# Patient Record
Sex: Male | Born: 1935 | Race: White | Hispanic: No | Smoking: Former smoker
Health system: Southern US, Community
[De-identification: ages and names within clinical notes are randomized; demographics above are authoritative.]

## PROBLEM LIST (undated history)

## (undated) DIAGNOSIS — J8410 Pulmonary fibrosis, unspecified: Secondary | ICD-10-CM

## (undated) DIAGNOSIS — M1990 Unspecified osteoarthritis, unspecified site: Secondary | ICD-10-CM

## (undated) DIAGNOSIS — I10 Essential (primary) hypertension: Secondary | ICD-10-CM

## (undated) HISTORY — PX: TESTICLE SURGERY: SHX794

---

## 2007-04-03 ENCOUNTER — Encounter: Payer: Self-pay | Admitting: Internal Medicine

## 2007-04-03 ENCOUNTER — Ambulatory Visit: Payer: Self-pay | Admitting: Internal Medicine

## 2007-04-03 ENCOUNTER — Ambulatory Visit (HOSPITAL_COMMUNITY): Admission: RE | Admit: 2007-04-03 | Discharge: 2007-04-03 | Payer: Self-pay | Admitting: Internal Medicine

## 2009-09-29 ENCOUNTER — Inpatient Hospital Stay (HOSPITAL_COMMUNITY): Admission: AD | Admit: 2009-09-29 | Discharge: 2009-10-03 | Payer: Self-pay | Admitting: Internal Medicine

## 2009-09-30 ENCOUNTER — Encounter: Payer: Self-pay | Admitting: Internal Medicine

## 2009-10-14 DIAGNOSIS — D696 Thrombocytopenia, unspecified: Secondary | ICD-10-CM

## 2009-10-14 DIAGNOSIS — J8410 Pulmonary fibrosis, unspecified: Secondary | ICD-10-CM

## 2009-10-14 DIAGNOSIS — I10 Essential (primary) hypertension: Secondary | ICD-10-CM | POA: Insufficient documentation

## 2009-10-14 DIAGNOSIS — E876 Hypokalemia: Secondary | ICD-10-CM | POA: Insufficient documentation

## 2009-10-14 DIAGNOSIS — M069 Rheumatoid arthritis, unspecified: Secondary | ICD-10-CM | POA: Insufficient documentation

## 2009-10-15 ENCOUNTER — Ambulatory Visit: Payer: Self-pay | Admitting: Internal Medicine

## 2009-11-27 ENCOUNTER — Ambulatory Visit: Payer: Self-pay | Admitting: Internal Medicine

## 2010-05-26 NOTE — Assessment & Plan Note (Signed)
Summary: Pulmonary/ new pt eval for RA/ ILD   Visit Type:  Initial Consult Copy to:  Dr. Carylon Perches Primary Provider/Referring Provider:  Dr. Carylon Perches  CC:  Pulmonary Fibrosis.  History of Present Illness: 77 yowm quit smoking in 1965 with no resp issues but slowed some by RA which has been more active x 7-8 years requiring mtx and remicade.  October 15, 2009 1st pulmonary eval for abn ct with recent tick bites with fever up to 103 gait disturbance  and fatigue and loss appetite with 10 lb and better on day of ov.  No limiting doe but not very active. no cough.  Pt denies any significant sore throat, dysphagia, itching, sneezing,  nasal congestion or excess secretions,  fever, chills, sweats, unintended wt loss, pleuritic or exertional cp, hempoptysis, change in activity tolerance  orthopnea pnd or leg swelling Pt also denies any obvious fluctuation in symptoms with weather or environmental change or other alleviating or aggravating factors.       Current Medications (verified): 1)  Doxycycline Hyclate 100 Mg Caps (Doxycycline Hyclate) .... Take 1 Tablet By Mouth Two Times A Day 2)  Tenormin 50 Mg Tabs (Atenolol) .... Take 1 Tablet By Mouth Two Times A Day 3)  Hydrochlorothiazide 25 Mg Tabs (Hydrochlorothiazide) .... Take 1 Tablet By Mouth Once A Day 4)  Folic Acid 1 Mg Tabs (Folic Acid) .... Take 1 Tablet By Mouth Once A Day 5)  Aspirin 81 Mg Tbec (Aspirin) .... Take 1 Tablet By Mouth Once A Day 6)  Alprazolam 1 Mg Tabs (Alprazolam) .... 1/2 At Bedtime As Needed 7)  Hydroxyzine Hcl 25 Mg Tabs (Hydroxyzine Hcl) .Marland Kitchen.. 1 Two Times A Day As Needed 8)  Benadryl 25 Mg Tabs (Diphenhydramine Hcl) .... As Directed 3 Days Prior To Taking Rheumacaid 9)  Calcium-Vitamin D 600-125 Mg-Unit Tabs (Calcium-Vitamin D) .Marland Kitchen.. 1 Two Times A Day 10)  Vitamin B-12 1000 Mcg Tabs (Cyanocobalamin) .Marland Kitchen.. 1 Once Daily 11)  Alendronate Sodium 70 Mg Tabs (Alendronate Sodium) .... Once Per Wk 12)  Vitamin D3 1000 Unit Tabs  (Cholecalciferol) .Marland Kitchen.. 1 Once Daily 13)  Methotrexate 2.5 Mg Tabs (Methotrexate Sodium) .... 8 Tablets Every Friday  Allergies (verified): No Known Drug Allergies  Past History:  Past Medical History: THROMBOCYTOPENIA (ICD-287.5) HYPERTENSION (ICD-401.9) HYPOKALEMIA (ICD-276.8) PULMONARY FIBROSIS (ICD-515)    - CT chest 09/30/2009    - PFT's ordered October 16, 2009  ARTHRITIS, RHEUMATOID (ICD-714.0)..............................Marland KitchenDr Wilford Grist       - Stopped all rx on September 29, 2009 due to fuo    Past Surgical History: Hernia repair 1965  Social History: Former smoker.  Quit in 1965.  Smoked for approx 10 yrs- ? how many cigs per day No ETOH Married  Review of Systems  The patient denies anorexia, fever, weight loss, weight gain, vision loss, decreased hearing, hoarseness, chest pain, syncope, dyspnea on exertion, peripheral edema, prolonged cough, headaches, hemoptysis, abdominal pain, melena, hematochezia, severe indigestion/heartburn, hematuria, incontinence, genital sores, muscle weakness, suspicious skin lesions, transient blindness, difficulty walking, depression, unusual weight change, abnormal bleeding, enlarged lymph nodes, angioedema, breast masses, and testicular masses.    Vital Signs:  Patient profile:   77 year old male Height:      69 inches Weight:      169 pounds BMI:     25.05 O2 Sat:      96 % on Room air Temp:     96.9 degrees F oral Pulse rate:   56 / minute BP  sitting:   112 / 70  (left arm)  Vitals Entered By: Vernie Murders (October 15, 2009 1:41 PM)  O2 Flow:  Room air   CXR  Procedure date:  09/30/2009  Findings:      PF with non-specific adenopathy and no GG changes  Impression & Recommendations:  Problem # 1:  PULMONARY FIBROSIS (ICD-515) DDx for pulmonary fibrosis with honeycombing includes idiopathic pulmonary fibrosis, pulmonary fibrosis associated with rheumatologic disease, adverse effect from  drugs such as chemotherapy or amiodarone  exposure (or MTX), nonspecific interstitial pneumonia which is typically steroid responsive, and chronic hypersensitivity pneumonitis.   Based on absence of clubbing and presentation with RA co's >>> resp symptoms in all likelihood this is the type of PF assoc with CV dz which has a relatively good prognosis.  Hard to exclude mtx lung dz but doubt clinically and ok to rechallenge and see how he does clinically but ideally need to get baseline pft's first.  Medications Added to Medication List This Visit: 1)  Alprazolam 1 Mg Tabs (Alprazolam) .... 1/2 at bedtime as needed 2)  Hydroxyzine Hcl 25 Mg Tabs (Hydroxyzine hcl) .Marland Kitchen.. 1 two times a day as needed 3)  Benadryl 25 Mg Tabs (Diphenhydramine hcl) .... As directed 3 days prior to taking rheumacaid 4)  Calcium-vitamin D 600-125 Mg-unit Tabs (Calcium-vitamin d) .Marland Kitchen.. 1 two times a day 5)  Vitamin B-12 1000 Mcg Tabs (Cyanocobalamin) .Marland Kitchen.. 1 once daily 6)  Alendronate Sodium 70 Mg Tabs (Alendronate sodium) .... Once per wk 7)  Vitamin D3 1000 Unit Tabs (Cholecalciferol) .Marland Kitchen.. 1 once daily 8)  Methotrexate 2.5 Mg Tabs (Methotrexate sodium) .... 8 tablets every friday  Other Orders: New Patient Level V (16109)  Patient Instructions: 1)  Stay as active as you can and let us know if breathing is what limits your activity 2)  Please schedule a follow-up appointment in 6 weeks, sooner if needed for PFT's and CXR  3)  Ok with me for use Methotrexate for your arthritis unless you find your breathing is getting while your arthritis is not  4)  Copy sent to: 5)  Dr Ouida Sills and Kerri Perches

## 2010-05-26 NOTE — Miscellaneous (Signed)
Summary: Orders Update pft charges  Clinical Lists Changes  Orders: Added new Service order of Lung Volumes (94240) - Signed Added new Service order of Carbon Monoxide diffusing w/capacity (94720) - Signed Added new Service order of Spirometry (Pre & Post) (94060) - Signed 

## 2010-05-26 NOTE — Assessment & Plan Note (Signed)
Summary: Pulmonary/ final summary f/u ov with pft's wnl    Visit Type:  Follow-up Copy to:  Dr. Carylon Perches Primary Provider/Referring Provider:  Dr. Carylon Perches  CC:  Patient here for follow-up today and had PFT's. No complaints..  History of Present Illness: 39 yowm quit smoking in 1965 with no resp issues but slowed some by RA which has been more active x 7-8 years requiring mtx and remicade.  October 15, 2009 1st pulmonary eval for abn ct with recent tick bites with fever up to 103 gait disturbance  and fatigue and loss appetite with 10 lb and better on day of ov.  No limiting doe but not very active. no cough.  restarted on mtx     November 27, 2009 Patient here for follow-up today and had PFT's. No complaints,  ra is better but not back to baseline. no cough or sob.  Pt denies any significant sore throat, dysphagia, itching, sneezing,  nasal congestion or excess secretions,  fever, chills, sweats, unintended wt loss, pleuritic or exertional cp, hempoptysis, change in activity tolerance  orthopnea pnd or leg swelling. Pt also denies any obvious fluctuation in symptoms with weather or environmental change or other alleviating or aggravating factors.       Current Medications (verified): 1)  Doxycycline Hyclate 100 Mg Caps (Doxycycline Hyclate) .... Take 1 Tablet By Mouth Two Times A Day 2)  Tenormin 50 Mg Tabs (Atenolol) .... Take 1 Tablet By Mouth Two Times A Day 3)  Hydrochlorothiazide 25 Mg Tabs (Hydrochlorothiazide) .... Take 1 Tablet By Mouth Once A Day 4)  Folic Acid 1 Mg Tabs (Folic Acid) .... Take 1 Tablet By Mouth Once A Day 5)  Aspirin 81 Mg Tbec (Aspirin) .... Take 1 Tablet By Mouth Once A Day 6)  Alprazolam 1 Mg Tabs (Alprazolam) .... 1/2 At Bedtime As Needed 7)  Hydroxyzine Hcl 25 Mg Tabs (Hydroxyzine Hcl) .Marland Kitchen.. 1 Two Times A Day As Needed 8)  Benadryl 25 Mg Tabs (Diphenhydramine Hcl) .... As Directed 3 Days Prior To Taking Rheumacaid 9)  Calcium-Vitamin D 600-125 Mg-Unit Tabs  (Calcium-Vitamin D) .Marland Kitchen.. 1 Two Times A Day 10)  Vitamin B-12 1000 Mcg Tabs (Cyanocobalamin) .Marland Kitchen.. 1 Once Daily 11)  Alendronate Sodium 70 Mg Tabs (Alendronate Sodium) .... Once Per Wk 12)  Vitamin D3 1000 Unit Tabs (Cholecalciferol) .Marland Kitchen.. 1 Once Daily 13)  Methotrexate 2.5 Mg Tabs (Methotrexate Sodium) .... 8 Tablets Every Friday  Allergies (verified): No Known Drug Allergies  Past History:  Past Medical History: THROMBOCYTOPENIA (ICD-287.5) HYPERTENSION (ICD-401.9) HYPOKALEMIA (ICD-276.8) PULMONARY FIBROSIS (ICD-515)    - CT chest 09/30/2009    - PFT's November 27, 2009 FEV1 2.71 (98%) ratio 67%  DLC0 77% . corrects to 97% ARTHRITIS, RHEUMATOID (ICD-714.0)..............................Marland KitchenDr Wilford Grist       - Stopped all rx on September 29, 2009 due to fuo    Vital Signs:  Patient profile:   77 year old male Height:      70 inches (177.80 cm) Weight:      164 pounds (74.55 kg) BMI:     23.62 O2 Sat:      96 % on Room air Temp:     97.4 degrees F (36.33 degrees C) oral Pulse rate:   83 / minute BP sitting:   124 / 76  (right arm) Cuff size:   regular  Vitals Entered By: Michel Bickers CMA (November 27, 2009 11:26 AM)  O2 Sat at Rest %:  96 O2  Flow:  Room air CC: Patient here for follow-up today and had PFT's. No complaints. Comments Medications reviewed with the patient. Daytime phone verified. Michel Bickers CMA  November 27, 2009 11:28 AM   Physical Exam  Additional Exam:  somewhat cantankerous amb wm nad HEENT mild turbinate edema.  Oropharynx no thrush or excess pnd or cobblestoning.  No JVD or cervical adenopathy. Mild accessory muscle hypertrophy. Trachea midline, nl thryroid. Chest was hyperinflated by percussion with diminished breath sounds and moderate increased exp time without wheeze. Hoover sign positive at mid inspiration. Regular rate and rhythm without murmur gallop or rub or increase P2 or edema.  Abd: no hsm, nl excursion. Ext warm without cyanosis or clubbing.  obvious ra  changes both hands including MCPs   CXR  Procedure date:  11/27/2009  Findings:      mild/ moderate fibrotic pattern with no change from prev studies  Impression & Recommendations:  Problem # 1:  PULMONARY FIBROSIS (ICD-515)  DDx for pulmonary fibrosis with honeycombing includes idiopathic pulmonary fibrosis, pulmonary fibrosis associated with rheumatologic disease, adverse effect from  drugs such as chemotherapy or amiodarone exposure (or MTX), nonspecific interstitial pneumonia which is typically steroid responsive, and chronic hypersensitivity pneumonitis.   Based on absence of clubbing and presentation with RA co's >>> resp symptoms in all likelihood this is the type of PF assoc with CV dz which has a relatively good prognosis.  Hard to exclude mtx lung dz but doubt clinically and ok p  rechallenged  - can follow with serial cxr and pft's yearly at this point unless new pulmonary symptoms develop based on a very good baseline functional level established today despite smoking hx  Other Orders: T-2 View CXR (71020TC) Est. Patient Level IV (16109)  Patient Instructions: 1)  Follow up here if breathing worsens or if your doctors want [place] to track your lung function on methotrexate with annual cxr and lung functions   Appended Document: Pulmonary/ final summary f/u ov with pft's wnl  copy also to Dr Wilford Grist

## 2010-07-13 LAB — URINALYSIS, ROUTINE W REFLEX MICROSCOPIC
Bilirubin Urine: NEGATIVE
Leukocytes, UA: NEGATIVE
Nitrite: NEGATIVE
Urobilinogen, UA: 0.2 mg/dL (ref 0.0–1.0)
pH: 6.5 (ref 5.0–8.0)

## 2010-07-13 LAB — DIFFERENTIAL
Basophils Absolute: 0 10*3/uL (ref 0.0–0.1)
Basophils Absolute: 0 10*3/uL (ref 0.0–0.1)
Basophils Absolute: 0.1 10*3/uL (ref 0.0–0.1)
Basophils Relative: 1 % (ref 0–1)
Eosinophils Absolute: 0 10*3/uL (ref 0.0–0.7)
Eosinophils Absolute: 0 10*3/uL (ref 0.0–0.7)
Lymphs Abs: 0.2 10*3/uL — ABNORMAL LOW (ref 0.7–4.0)
Lymphs Abs: 1 10*3/uL (ref 0.7–4.0)
Monocytes Absolute: 0.4 10*3/uL (ref 0.1–1.0)
Monocytes Absolute: 0.5 10*3/uL (ref 0.1–1.0)
Monocytes Absolute: 1.6 10*3/uL — ABNORMAL HIGH (ref 0.1–1.0)
Monocytes Relative: 25 % — ABNORMAL HIGH (ref 3–12)
Monocytes Relative: 9 % (ref 3–12)
Neutro Abs: 3.6 10*3/uL (ref 1.7–7.7)
Neutro Abs: 5.1 10*3/uL (ref 1.7–7.7)
Neutro Abs: 6.4 10*3/uL (ref 1.7–7.7)
Neutrophils Relative %: 57 % (ref 43–77)
Neutrophils Relative %: 85 % — ABNORMAL HIGH (ref 43–77)
Neutrophils Relative %: 91 % — ABNORMAL HIGH (ref 43–77)

## 2010-07-13 LAB — BASIC METABOLIC PANEL
CO2: 25 mEq/L (ref 19–32)
Calcium: 7.5 mg/dL — ABNORMAL LOW (ref 8.4–10.5)
Calcium: 7.7 mg/dL — ABNORMAL LOW (ref 8.4–10.5)
Chloride: 104 mEq/L (ref 96–112)
Creatinine, Ser: 0.95 mg/dL (ref 0.4–1.5)
GFR calc Af Amer: >60 mL/min (ref >60)
GFR calc Af Amer: >60 mL/min (ref >60)
Glucose, Bld: 99 mg/dL (ref 70–99)
Potassium: 3.1 mEq/L — ABNORMAL LOW (ref 3.5–5.1)

## 2010-07-13 LAB — CULTURE, BLOOD (ROUTINE X 2)
Culture: NO GROWTH
Culture: NO GROWTH
Report Status: 6112011

## 2010-07-13 LAB — EPSTEIN-BARR VIRUS VCA ANTIBODY PANEL
EBV EA IgG: 2.08 {ISR} — ABNORMAL HIGH
EBV NA IgG: 4.72 {ISR} — ABNORMAL HIGH

## 2010-07-13 LAB — COMPREHENSIVE METABOLIC PANEL
Albumin: 3.7 g/dL (ref 3.5–5.2)
BUN: 18 mg/dL (ref 6–23)
Calcium: 8.7 mg/dL (ref 8.4–10.5)
Glucose, Bld: 129 mg/dL — ABNORMAL HIGH (ref 70–99)
Potassium: 3.7 mEq/L (ref 3.5–5.1)
Sodium: 127 mEq/L — ABNORMAL LOW (ref 135–145)
Total Protein: 8.4 g/dL — ABNORMAL HIGH (ref 6.0–8.3)

## 2010-07-13 LAB — ROCKY MTN SPOTTED FVR AB, IGM-BLOOD: RMSF IgM: 0.25 IV (ref 0.00–0.89)

## 2010-07-13 LAB — CBC
HCT: 40.7 % (ref 39.0–52.0)
Hemoglobin: 13.4 g/dL (ref 13.0–17.0)
MCHC: 34.3 g/dL (ref 30.0–36.0)
MCHC: 34.5 g/dL (ref 30.0–36.0)
MCV: 92.9 fL (ref 78.0–100.0)
MCV: 94.3 fL (ref 78.0–100.0)
MCV: 94.5 fL (ref 78.0–100.0)
Platelets: 130 10*3/uL — ABNORMAL LOW (ref 150–400)
Platelets: 163 10*3/uL (ref 150–400)
RBC: 3.95 MIL/uL — ABNORMAL LOW (ref 4.22–5.81)
RBC: 4.13 MIL/uL — ABNORMAL LOW (ref 4.22–5.81)

## 2010-07-13 LAB — ROCKY MTN SPOTTED FVR AB, IGG-BLOOD: RMSF IgG: 0.13 IV

## 2010-07-13 LAB — URINE CULTURE: Colony Count: NO GROWTH

## 2010-07-13 LAB — URINE MICROSCOPIC-ADD ON

## 2010-09-08 NOTE — Op Note (Signed)
NAME:  Patrick Armstrong, SCHAFFERT             ACCOUNT NO.:  [id number]   MEDICAL RECORD NO.:  [id number]          PATIENT TYPE:  AMB   LOCATION:  DAY                           FACILITY:  [facility]   PHYSICIAN:  R. Roetta Sessions, M.D. DATE OF BIRTH:  1932/03/30   DATE OF PROCEDURE:  04/03/2007  DATE OF DISCHARGE:                               OPERATIVE REPORT   ILEOCOLONOSCOPY WITH BIOPSY:   INDICATIONS FOR PROCEDURE:  A 77 year old gentleman sent over through  the courtesy of Dr. Ouida Sills for colorectal cancer screening via  colonoscopy.  He has never had his lower GI tract imaged.  He is devoid  of any lower GI tract symptoms.  There is no family history of  colorectal neoplasia.  Colonoscopy is now being done as a screening  maneuver.  This approach has been discussed with the patient at length.  Potential risks, benefits and alternatives have been reviewed, questions  answered, he is agreeable.  .  Please see the documentation in the  medical record.   PROCEDURE NOTE:  O2 saturation, blood pressure, pulse and respirations  were monitored throughout the entirety of the procedure.  Conscious  sedation:  Versed 3 mg IV, Demerol 75 mg IV in divided doses.  Instrument:  Pentax video chip system.  Digital rectal exam revealed no  abnormalities.  Endoscopic findings:  The prep was adequate.   Colon:  Colonic mucosa was surveyed from the rectosigmoid junction  through the left, transverse and right colon to area of the appendiceal  orifice, ileocecal valve and cecum.  These structures were well-seen and  photographed for the record.  The terminal ileum was intubated to 5 cm.  From this level the scope was slowly and cautiously withdrawn.  All the  mucosal surfaces were once again surveyed.  Quite a bit of time was  taken using tip deflection for fold flattening to see all the mucosal  surfaces.  The patient had pancolonic diverticula (all the way to the  cecum, left-sided greater than right-sided).  In  the mid descending  colon there were some somewhat-fatty appearing folds.  There was one  fold that was probably three times the width of the next largest fold,  had some superficial erosions.  There did not appear to be any other  abnormal mucosal process.  This appeared to be a lipomatous-type, benign-  appearing fold.  This area was biopsied to prove benignity.  The  remainder of the colonic mucosa appeared normal.  The scope was pulled  down into the rectum where a thorough examination of the rectal mucosa  including retroflexed view of the anal verge demonstrated no  abnormalities.  Cecal withdrawal time 10 minutes.  The patient tolerated  the procedure well, was reacted in endoscopy.   IMPRESSION:  1. Normal rectum.  2. Pancolonic diverticula (left-sided greater than right-sided).  3. Focally thickened folds, mid descending colon.  One sentinel fold      was particularly thick with some superficial erosions.  This      appeared to be a benign finding.  This fold was biopsied.   RECOMMENDATIONS:  1.  Diverticulosis literature provided to Mr. Morrie Sheldon.  2. Follow up on pathology.  3. Further recommendations to follow.      Jonathon Bellows, M.D.  Electronically Signed     RMR/MEDQ  D:  04/03/2007  T:  04/03/2007  Job:  [id number]   cc:   Kingsley Callander. Ouida Sills, MD  Fax: [phone]

## 2011-03-01 ENCOUNTER — Ambulatory Visit (HOSPITAL_COMMUNITY)
Admission: RE | Admit: 2011-03-01 | Discharge: 2011-03-01 | Disposition: A | Discharge status: Home / Self Care | Payer: Medicare Other | Source: Ambulatory Visit | Attending: Internal Medicine | Admitting: Internal Medicine

## 2011-03-01 ENCOUNTER — Other Ambulatory Visit (HOSPITAL_COMMUNITY): Payer: Self-pay | Admitting: Internal Medicine

## 2011-03-01 DIAGNOSIS — J8410 Pulmonary fibrosis, unspecified: Secondary | ICD-10-CM

## 2011-05-27 DIAGNOSIS — M069 Rheumatoid arthritis, unspecified: Secondary | ICD-10-CM | POA: Diagnosis not present

## 2011-05-28 DIAGNOSIS — M069 Rheumatoid arthritis, unspecified: Secondary | ICD-10-CM | POA: Diagnosis not present

## 2011-06-11 IMAGING — CT CT CHEST W/ CM
2 of 5 series · 15 of 36 positions shown, 18 images · IV contrast (Omnipaque 300)
Comparison: 09/29/2009 chest x-ray

CLINICAL DATA: Abnormal chest x-ray with interstitial opacities.
Patient with fever.

CT CHEST WITH CONTRAST
TECHNIQUE: Multidetector CT imaging of the chest was performed
following the standard protocol during bolus administration of
intravenous contrast.
Contrast: 80 ml intravenous Omnipaque-G66

[Series 2: chestroutine 5.0 b40f · axial · 0.69mm/px · z∈[-330,-35]mm · 12 of 65 slices shown, 15 images]
[im 3/65  mediastinal]
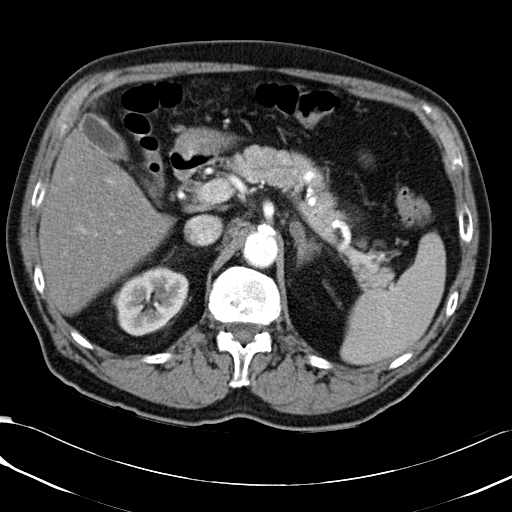
[im 3/65  lung]
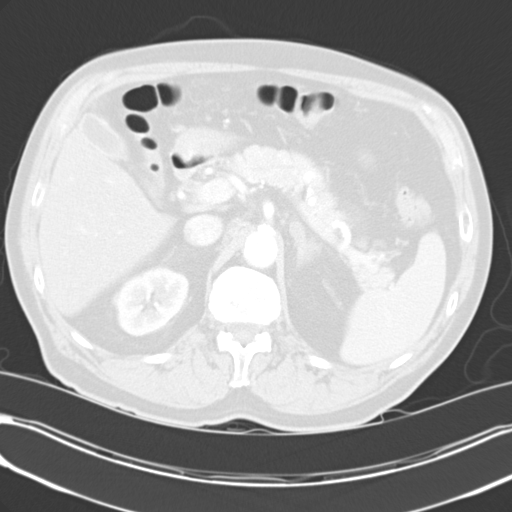
[im 9/65  lung]
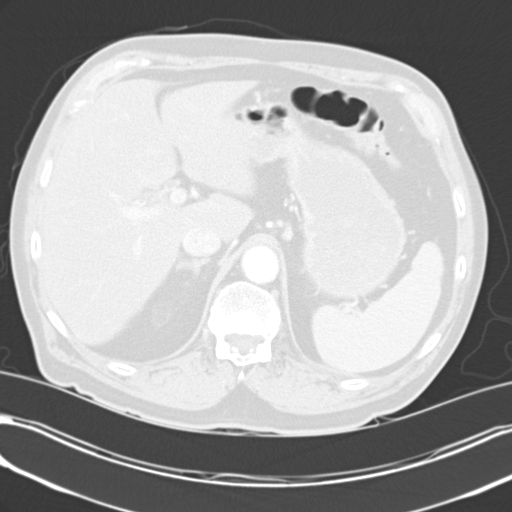
[im 14/65  lung]
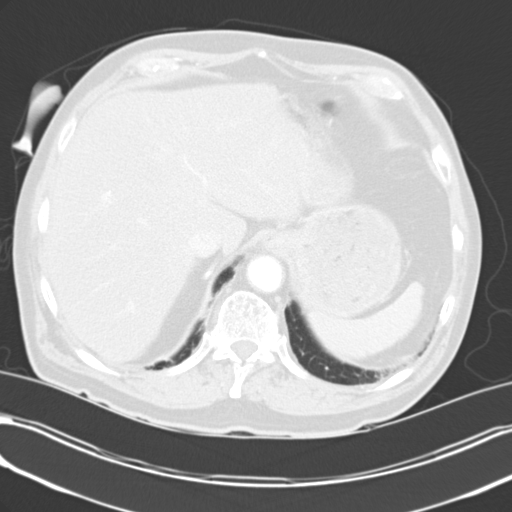
[im 20/65  lung]
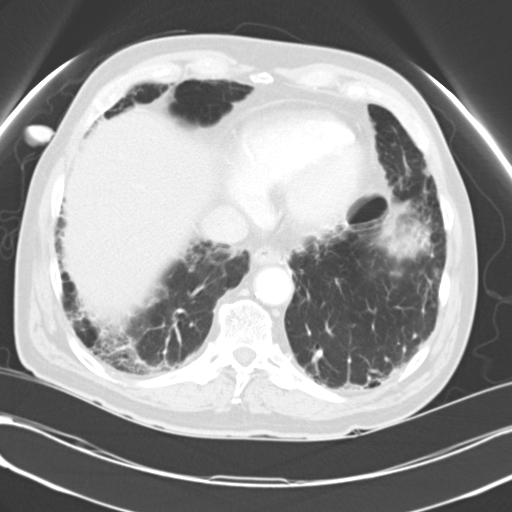
[im 26/65  mediastinal]
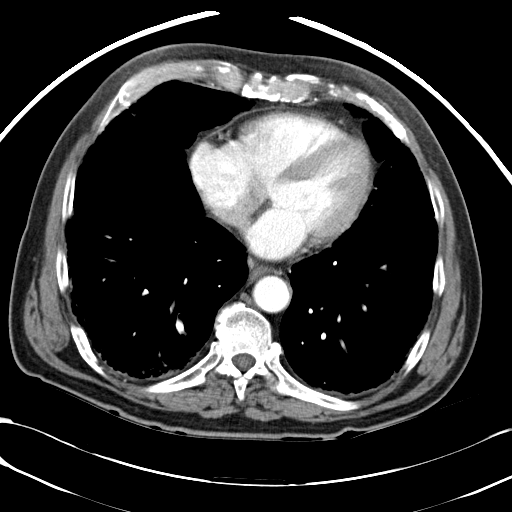
[im 26/65  lung]
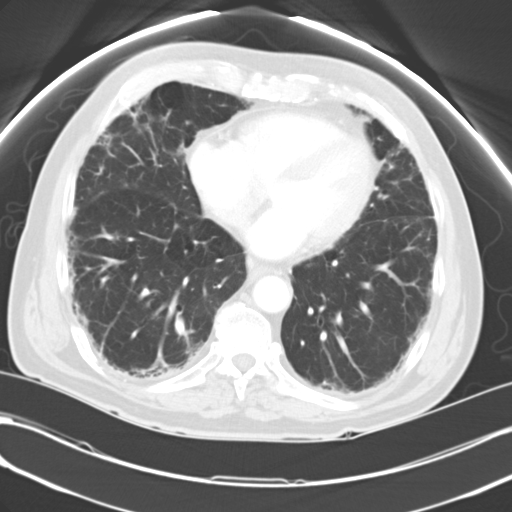
[im 31/65  lung]
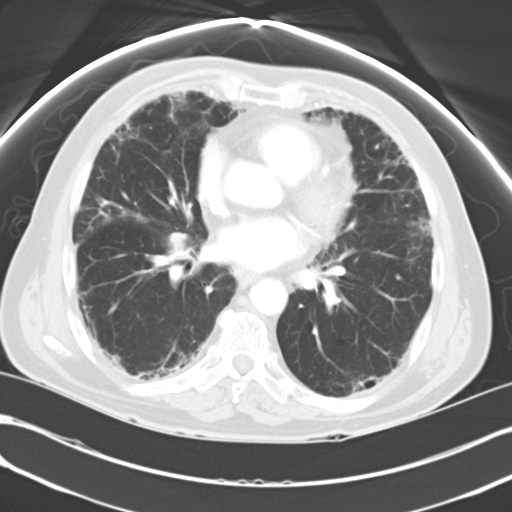
[im 34/65  lung]
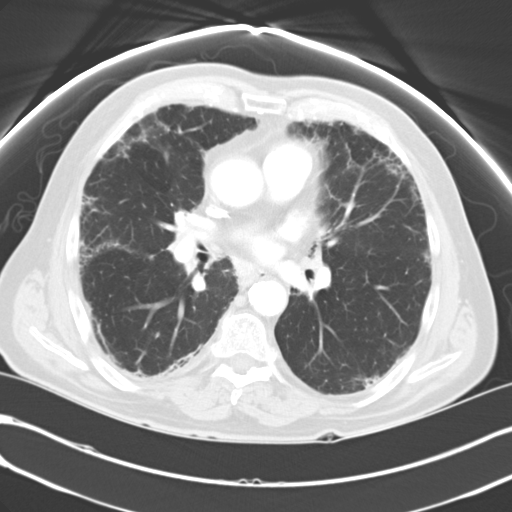
[im 39/65  lung]
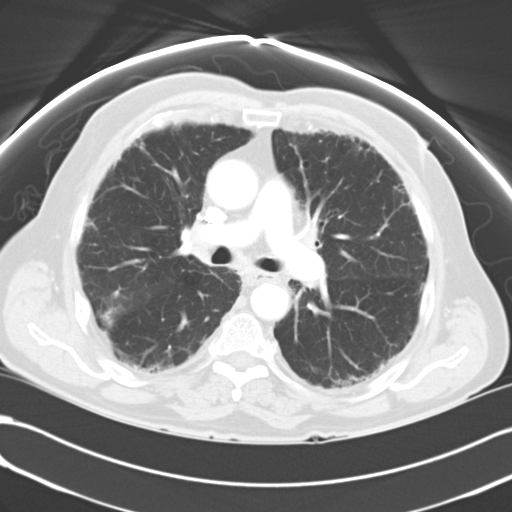
[im 45/65  mediastinal]
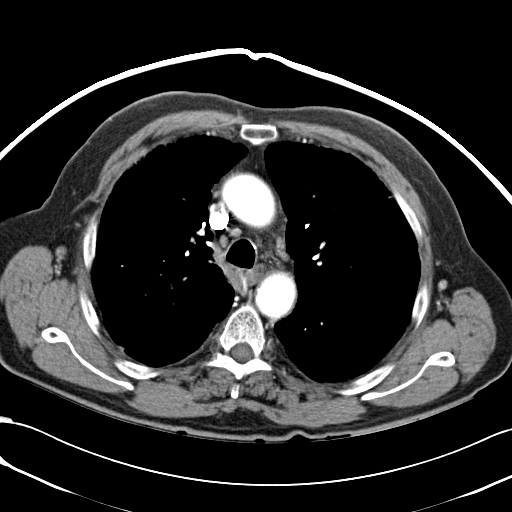
[im 45/65  lung]
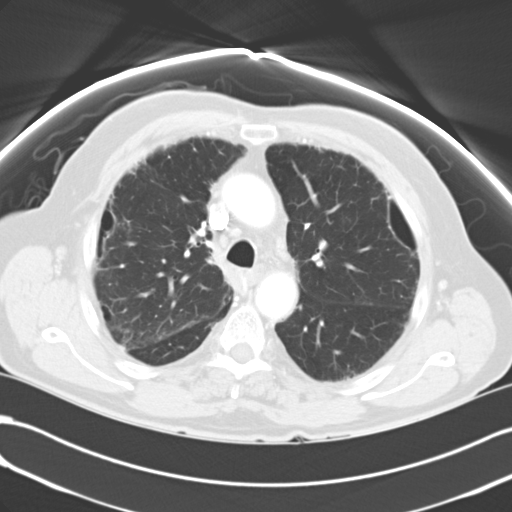
[im 51/65  lung]
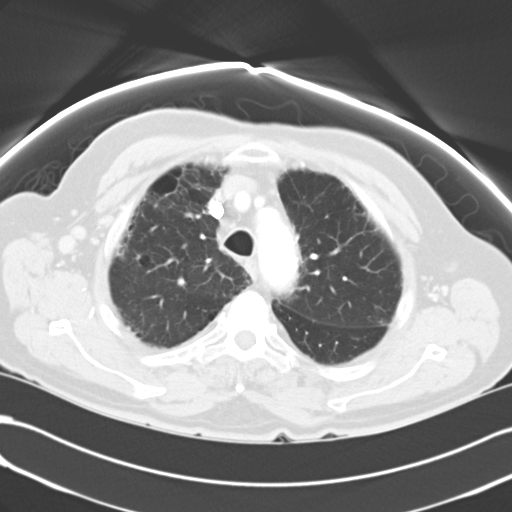
[im 56/65  lung]
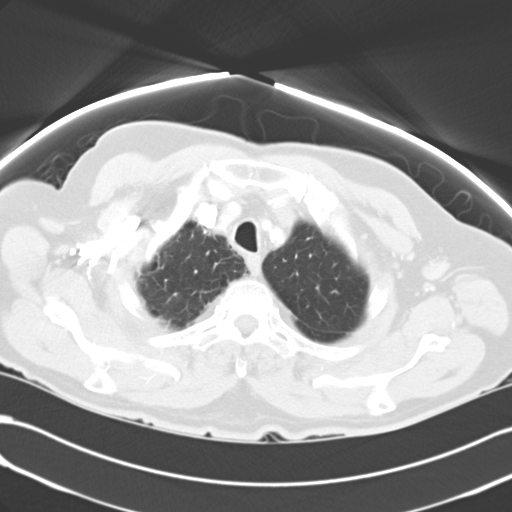
[im 62/65  lung]
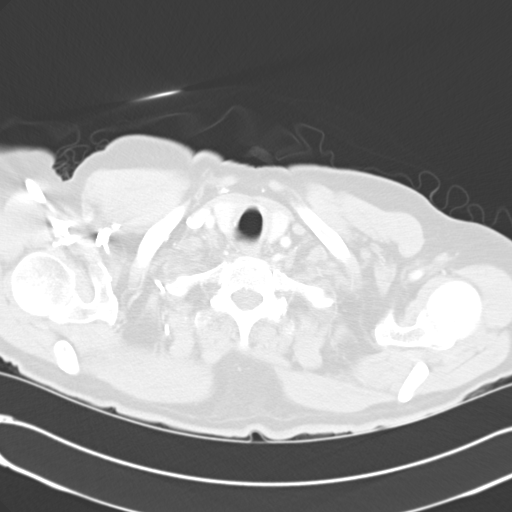

[Series 4: mpr coronal chest · coronal · 0.63mm/px · 3 of 83 slices shown]
[im 17/83  lung]
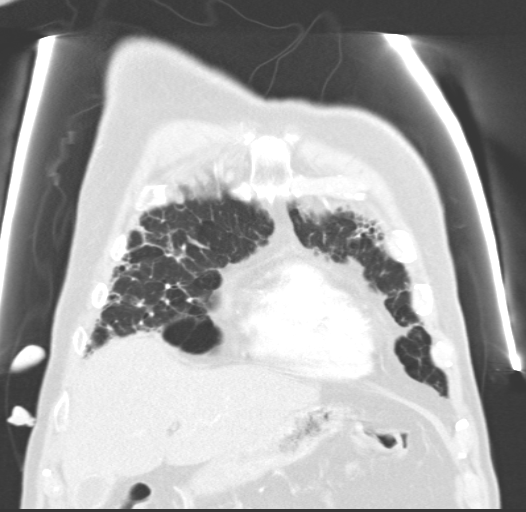
[im 33/83  lung]
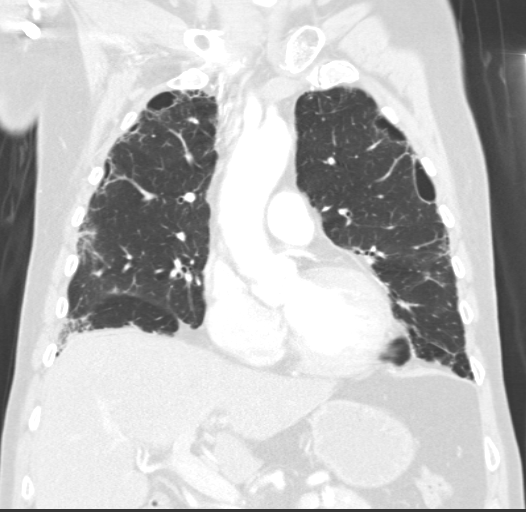
[im 50/83  lung]
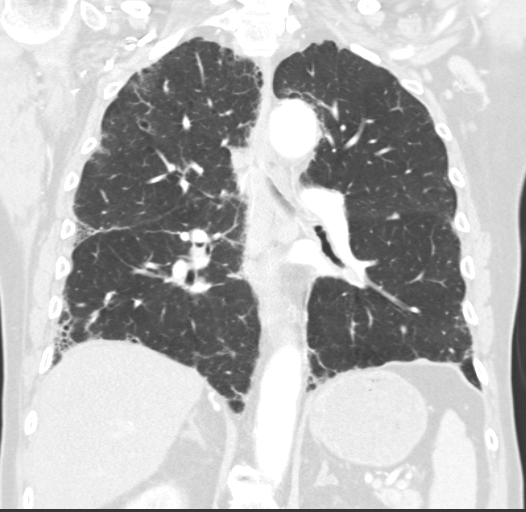

[15 of 36 positions shown; findings below may reference images not displayed]

FINDINGS: Mild to moderate coronary artery calcifications are
identified.
There is no evidence of thoracic aortic aneurysm or dissection.
No pleural or pericardial effusions are identified.

Upper limits of normal sized and mildly enlarged bilateral
axillary, mediastinal, and hilar lymph nodes are identified with
index nodes including the following:
A 1.3 x 2.5 cm AP window node (image 22)
A 1.4 x 2.2 cm subcarinal node (image 29)
A 1.8 x 1.8 cm right hilar node (image 34)
A 1.1 x 1.4 cm right axillary node (image 15)

Peripheral interlobular septal thickening with honeycombing is
identified compatible with pulmonary fibrosis/UIP. No associated
ground-glass opacities are noted to suggest active inflammation.
There is no evidence of focal airspace disease, consolidation or
pulmonary masses.

No endotracheal or endobronchial lesions are identified.

No acute or suspicious bony abnormalities are identified.
Fullness/adenomas of the left adrenal gland are noted.
IMPRESSION: Findings compatible with pulmonary fibrosis/UIP - no evidence of
active inflammation.

Upper limits of normal sized and mildly enlarged axillary,
mediastinal, and hilar lymph nodes.  Although these may be
reactive, consider CT follow-up in 3-6 months if no prior outside
studies are available for comparison.

Coronary artery disease.

## 2011-07-22 DIAGNOSIS — M069 Rheumatoid arthritis, unspecified: Secondary | ICD-10-CM | POA: Diagnosis not present

## 2011-08-25 DIAGNOSIS — L570 Actinic keratosis: Secondary | ICD-10-CM | POA: Diagnosis not present

## 2011-08-25 DIAGNOSIS — L299 Pruritus, unspecified: Secondary | ICD-10-CM | POA: Diagnosis not present

## 2011-09-02 DIAGNOSIS — M069 Rheumatoid arthritis, unspecified: Secondary | ICD-10-CM | POA: Diagnosis not present

## 2011-09-10 DIAGNOSIS — M069 Rheumatoid arthritis, unspecified: Secondary | ICD-10-CM | POA: Diagnosis not present

## 2011-09-10 DIAGNOSIS — I10 Essential (primary) hypertension: Secondary | ICD-10-CM | POA: Diagnosis not present

## 2011-09-29 DIAGNOSIS — M79609 Pain in unspecified limb: Secondary | ICD-10-CM | POA: Diagnosis not present

## 2011-09-29 DIAGNOSIS — M069 Rheumatoid arthritis, unspecified: Secondary | ICD-10-CM | POA: Diagnosis not present

## 2011-10-19 DIAGNOSIS — M069 Rheumatoid arthritis, unspecified: Secondary | ICD-10-CM | POA: Diagnosis not present

## 2011-11-02 DIAGNOSIS — M069 Rheumatoid arthritis, unspecified: Secondary | ICD-10-CM | POA: Diagnosis not present

## 2011-11-05 DIAGNOSIS — L299 Pruritus, unspecified: Secondary | ICD-10-CM | POA: Diagnosis not present

## 2011-11-18 DIAGNOSIS — M069 Rheumatoid arthritis, unspecified: Secondary | ICD-10-CM | POA: Diagnosis not present

## 2011-12-01 DIAGNOSIS — M069 Rheumatoid arthritis, unspecified: Secondary | ICD-10-CM | POA: Diagnosis not present

## 2011-12-21 DIAGNOSIS — R197 Diarrhea, unspecified: Secondary | ICD-10-CM | POA: Diagnosis not present

## 2012-01-20 DIAGNOSIS — M069 Rheumatoid arthritis, unspecified: Secondary | ICD-10-CM | POA: Diagnosis not present

## 2012-01-21 DIAGNOSIS — Z23 Encounter for immunization: Secondary | ICD-10-CM | POA: Diagnosis not present

## 2012-01-25 DIAGNOSIS — Z961 Presence of intraocular lens: Secondary | ICD-10-CM | POA: Diagnosis not present

## 2012-01-25 DIAGNOSIS — H35379 Puckering of macula, unspecified eye: Secondary | ICD-10-CM | POA: Diagnosis not present

## 2012-01-25 DIAGNOSIS — H04129 Dry eye syndrome of unspecified lacrimal gland: Secondary | ICD-10-CM | POA: Diagnosis not present

## 2012-01-25 DIAGNOSIS — H179 Unspecified corneal scar and opacity: Secondary | ICD-10-CM | POA: Diagnosis not present

## 2012-03-01 DIAGNOSIS — M069 Rheumatoid arthritis, unspecified: Secondary | ICD-10-CM | POA: Diagnosis not present

## 2012-03-07 DIAGNOSIS — L299 Pruritus, unspecified: Secondary | ICD-10-CM | POA: Diagnosis not present

## 2012-03-10 DIAGNOSIS — Z79899 Other long term (current) drug therapy: Secondary | ICD-10-CM | POA: Diagnosis not present

## 2012-03-10 DIAGNOSIS — M069 Rheumatoid arthritis, unspecified: Secondary | ICD-10-CM | POA: Diagnosis not present

## 2012-03-10 DIAGNOSIS — I10 Essential (primary) hypertension: Secondary | ICD-10-CM | POA: Diagnosis not present

## 2012-03-17 DIAGNOSIS — I10 Essential (primary) hypertension: Secondary | ICD-10-CM | POA: Diagnosis not present

## 2012-03-17 DIAGNOSIS — J8410 Pulmonary fibrosis, unspecified: Secondary | ICD-10-CM | POA: Diagnosis not present

## 2012-03-20 ENCOUNTER — Other Ambulatory Visit (HOSPITAL_COMMUNITY): Payer: Self-pay | Admitting: *Deleted

## 2012-03-21 ENCOUNTER — Encounter (HOSPITAL_COMMUNITY)
Admission: RE | Admit: 2012-03-21 | Discharge: 2012-03-21 | Disposition: A | Discharge status: Home / Self Care | Payer: Medicare Other | Source: Ambulatory Visit | Attending: Internal Medicine | Admitting: Internal Medicine

## 2012-03-21 DIAGNOSIS — M069 Rheumatoid arthritis, unspecified: Secondary | ICD-10-CM | POA: Diagnosis not present

## 2012-03-21 MED ORDER — SODIUM CHLORIDE 0.9 % IV SOLN
INTRAVENOUS | Status: DC
  Administered 2012-03-21: 08:00:00 via INTRAVENOUS

## 2012-03-21 MED ORDER — LORATADINE 10 MG PO TABS
ORAL_TABLET | ORAL | Status: AC
  Administered 2012-03-21: 10 mg via ORAL
  Filled 2012-03-21: qty 1

## 2012-03-21 MED ORDER — LORATADINE 10 MG PO TABS
10.0000 mg | ORAL_TABLET | Freq: Every day | ORAL | Status: DC
  Administered 2012-03-21: 10 mg via ORAL

## 2012-03-21 MED ORDER — ACETAMINOPHEN 500 MG PO TABS
ORAL_TABLET | ORAL | Status: AC
  Administered 2012-03-21: 1000 mg via ORAL
  Filled 2012-03-21: qty 2

## 2012-03-21 MED ORDER — ACETAMINOPHEN 500 MG PO TABS
1000.0000 mg | ORAL_TABLET | ORAL | Status: DC
  Administered 2012-03-21: 1000 mg via ORAL

## 2012-03-21 MED ORDER — METHYLPREDNISOLONE SODIUM SUCC 125 MG IJ SOLR
INTRAMUSCULAR | Status: AC
  Administered 2012-03-21: 100 mg via INTRAVENOUS
  Filled 2012-03-21: qty 2

## 2012-03-21 MED ORDER — METHYLPREDNISOLONE SODIUM SUCC 125 MG IJ SOLR
100.0000 mg | INTRAMUSCULAR | Status: DC
  Administered 2012-03-21: 100 mg via INTRAVENOUS

## 2012-03-21 MED ORDER — SODIUM CHLORIDE 0.9 % IV SOLN
1000.0000 mg | Freq: Once | INTRAVENOUS | Status: AC
  Administered 2012-03-21: 1000 mg via INTRAVENOUS
  Filled 2012-03-21: qty 100

## 2012-04-04 ENCOUNTER — Encounter (HOSPITAL_COMMUNITY)
Admission: RE | Admit: 2012-04-04 | Discharge: 2012-04-04 | Disposition: A | Discharge status: Home / Self Care | Payer: Medicare Other | Source: Ambulatory Visit | Attending: Internal Medicine | Admitting: Internal Medicine

## 2012-04-04 DIAGNOSIS — M069 Rheumatoid arthritis, unspecified: Secondary | ICD-10-CM | POA: Diagnosis not present

## 2012-04-04 MED ORDER — LORATADINE 10 MG PO TABS
ORAL_TABLET | ORAL | Status: AC
  Filled 2012-04-04: qty 1

## 2012-04-04 MED ORDER — LORATADINE 10 MG PO TABS
10.0000 mg | ORAL_TABLET | Freq: Every day | ORAL | Status: DC
  Administered 2012-04-04: 10 mg via ORAL

## 2012-04-04 MED ORDER — METHYLPREDNISOLONE SODIUM SUCC 125 MG IJ SOLR
INTRAMUSCULAR | Status: AC
  Filled 2012-04-04: qty 2

## 2012-04-04 MED ORDER — SODIUM CHLORIDE 0.9 % IV SOLN: INTRAVENOUS | Status: DC

## 2012-04-04 MED ORDER — ACETAMINOPHEN 500 MG PO TABS
1000.0000 mg | ORAL_TABLET | ORAL | Status: AC
  Administered 2012-04-04: 1000 mg via ORAL

## 2012-04-04 MED ORDER — ACETAMINOPHEN 500 MG PO TABS
ORAL_TABLET | ORAL | Status: AC
  Filled 2012-04-04: qty 2

## 2012-04-04 MED ORDER — METHYLPREDNISOLONE SODIUM SUCC 125 MG IJ SOLR
100.0000 mg | INTRAMUSCULAR | Status: AC
  Administered 2012-04-04: 100 mg via INTRAVENOUS

## 2012-04-04 MED ORDER — SODIUM CHLORIDE 0.9 % IV SOLN
1000.0000 mg | Freq: Once | INTRAVENOUS | Status: AC
  Administered 2012-04-04: 1000 mg via INTRAVENOUS
  Filled 2012-04-04: qty 100

## 2012-04-05 DIAGNOSIS — M069 Rheumatoid arthritis, unspecified: Secondary | ICD-10-CM | POA: Diagnosis not present

## 2012-08-03 DIAGNOSIS — L299 Pruritus, unspecified: Secondary | ICD-10-CM | POA: Diagnosis not present

## 2012-08-22 DIAGNOSIS — M069 Rheumatoid arthritis, unspecified: Secondary | ICD-10-CM | POA: Diagnosis not present

## 2012-08-28 ENCOUNTER — Other Ambulatory Visit (HOSPITAL_COMMUNITY): Payer: Self-pay | Admitting: *Deleted

## 2012-08-29 ENCOUNTER — Encounter (HOSPITAL_COMMUNITY)
Admission: RE | Admit: 2012-08-29 | Discharge: 2012-08-29 | Disposition: A | Discharge status: Home / Self Care | Payer: Medicare Other | Source: Ambulatory Visit | Attending: Internal Medicine | Admitting: Internal Medicine

## 2012-08-29 DIAGNOSIS — M069 Rheumatoid arthritis, unspecified: Secondary | ICD-10-CM | POA: Diagnosis not present

## 2012-08-29 MED ORDER — ACETAMINOPHEN 500 MG PO TABS
1000.0000 mg | ORAL_TABLET | Freq: Four times a day (QID) | ORAL | Status: DC | PRN
  Administered 2012-08-29: 1000 mg via ORAL

## 2012-08-29 MED ORDER — SODIUM CHLORIDE 0.9 % IV SOLN
INTRAVENOUS | Status: DC
  Administered 2012-08-29: 10:00:00 via INTRAVENOUS

## 2012-08-29 MED ORDER — LORATADINE 10 MG PO TABS
ORAL_TABLET | ORAL | Status: AC
  Administered 2012-08-29: 10 mg via ORAL
  Filled 2012-08-29: qty 1

## 2012-08-29 MED ORDER — SODIUM CHLORIDE 0.9 % IV SOLN
1000.0000 mg | INTRAVENOUS | Status: DC
  Administered 2012-08-29: 1000 mg via INTRAVENOUS
  Filled 2012-08-29: qty 100

## 2012-08-29 MED ORDER — METHYLPREDNISOLONE SODIUM SUCC 125 MG IJ SOLR
INTRAMUSCULAR | Status: AC
  Filled 2012-08-29: qty 2

## 2012-08-29 MED ORDER — METHYLPREDNISOLONE SODIUM SUCC 125 MG IJ SOLR
100.0000 mg | Freq: Once | INTRAMUSCULAR | Status: DC
  Administered 2012-08-29: 100 mg via INTRAVENOUS

## 2012-08-29 MED ORDER — ACETAMINOPHEN 500 MG PO TABS
ORAL_TABLET | ORAL | Status: AC
  Administered 2012-08-29: 1000 mg via ORAL
  Filled 2012-08-29: qty 2

## 2012-08-29 MED ORDER — LORATADINE 10 MG PO TABS
10.0000 mg | ORAL_TABLET | Freq: Every day | ORAL | Status: DC
  Administered 2012-08-29: 10 mg via ORAL

## 2012-09-11 ENCOUNTER — Other Ambulatory Visit (HOSPITAL_COMMUNITY): Payer: Self-pay | Admitting: Internal Medicine

## 2012-09-11 ENCOUNTER — Ambulatory Visit (HOSPITAL_COMMUNITY)
Admission: RE | Admit: 2012-09-11 | Discharge: 2012-09-11 | Disposition: A | Discharge status: Home / Self Care | Payer: Medicare Other | Source: Ambulatory Visit | Attending: Internal Medicine | Admitting: Internal Medicine

## 2012-09-11 DIAGNOSIS — R059 Cough, unspecified: Secondary | ICD-10-CM

## 2012-09-11 DIAGNOSIS — R05 Cough: Secondary | ICD-10-CM

## 2012-09-11 DIAGNOSIS — R911 Solitary pulmonary nodule: Secondary | ICD-10-CM | POA: Diagnosis not present

## 2012-09-11 DIAGNOSIS — I10 Essential (primary) hypertension: Secondary | ICD-10-CM | POA: Diagnosis not present

## 2012-09-11 DIAGNOSIS — J8410 Pulmonary fibrosis, unspecified: Secondary | ICD-10-CM | POA: Diagnosis not present

## 2012-09-12 ENCOUNTER — Other Ambulatory Visit (HOSPITAL_COMMUNITY): Payer: Self-pay | Admitting: Internal Medicine

## 2012-09-12 ENCOUNTER — Encounter (HOSPITAL_COMMUNITY)
Admission: RE | Admit: 2012-09-12 | Discharge: 2012-09-12 | Disposition: A | Discharge status: Home / Self Care | Payer: Medicare Other | Source: Ambulatory Visit | Attending: Internal Medicine | Admitting: Internal Medicine

## 2012-09-12 DIAGNOSIS — R911 Solitary pulmonary nodule: Secondary | ICD-10-CM

## 2012-09-12 MED ORDER — ACETAMINOPHEN 500 MG PO TABS
1000.0000 mg | ORAL_TABLET | Freq: Four times a day (QID) | ORAL | Status: DC | PRN
  Administered 2012-09-12: 1000 mg via ORAL

## 2012-09-12 MED ORDER — SODIUM CHLORIDE 0.9 % IV SOLN
1000.0000 mg | INTRAVENOUS | Status: DC
  Administered 2012-09-12: 1000 mg via INTRAVENOUS
  Filled 2012-09-12: qty 100

## 2012-09-12 MED ORDER — LORATADINE 10 MG PO TABS
ORAL_TABLET | ORAL | Status: AC
  Filled 2012-09-12: qty 1

## 2012-09-12 MED ORDER — SODIUM CHLORIDE 0.9 % IV SOLN
INTRAVENOUS | Status: DC
  Administered 2012-09-12: 08:00:00 via INTRAVENOUS

## 2012-09-12 MED ORDER — LORATADINE 10 MG PO TABS
10.0000 mg | ORAL_TABLET | Freq: Every day | ORAL | Status: DC
Start: 2012-09-12 — End: 2012-09-13
  Administered 2012-09-12: 10 mg via ORAL

## 2012-09-12 MED ORDER — METHYLPREDNISOLONE SODIUM SUCC 125 MG IJ SOLR
100.0000 mg | Freq: Once | INTRAMUSCULAR | Status: AC
  Administered 2012-09-12: 100 mg via INTRAVENOUS

## 2012-09-12 MED ORDER — ACETAMINOPHEN 500 MG PO TABS
ORAL_TABLET | ORAL | Status: AC
  Filled 2012-09-12: qty 2

## 2012-09-12 MED ORDER — METHYLPREDNISOLONE SODIUM SUCC 125 MG IJ SOLR
INTRAMUSCULAR | Status: AC
  Filled 2012-09-12: qty 2

## 2012-09-14 ENCOUNTER — Ambulatory Visit (HOSPITAL_COMMUNITY)
Admission: RE | Admit: 2012-09-14 | Discharge: 2012-09-14 | Disposition: A | Discharge status: Home / Self Care | Payer: Medicare Other | Source: Ambulatory Visit | Attending: Internal Medicine | Admitting: Internal Medicine

## 2012-09-14 DIAGNOSIS — R911 Solitary pulmonary nodule: Secondary | ICD-10-CM | POA: Insufficient documentation

## 2012-09-14 DIAGNOSIS — I10 Essential (primary) hypertension: Secondary | ICD-10-CM | POA: Insufficient documentation

## 2012-10-23 DIAGNOSIS — M069 Rheumatoid arthritis, unspecified: Secondary | ICD-10-CM | POA: Diagnosis not present

## 2013-01-17 DIAGNOSIS — Z23 Encounter for immunization: Secondary | ICD-10-CM | POA: Diagnosis not present

## 2013-02-12 DIAGNOSIS — N390 Urinary tract infection, site not specified: Secondary | ICD-10-CM | POA: Diagnosis not present

## 2013-02-22 DIAGNOSIS — M069 Rheumatoid arthritis, unspecified: Secondary | ICD-10-CM | POA: Diagnosis not present

## 2013-03-06 DIAGNOSIS — Z79899 Other long term (current) drug therapy: Secondary | ICD-10-CM | POA: Diagnosis not present

## 2013-03-06 DIAGNOSIS — M069 Rheumatoid arthritis, unspecified: Secondary | ICD-10-CM | POA: Diagnosis not present

## 2013-03-13 ENCOUNTER — Other Ambulatory Visit (HOSPITAL_COMMUNITY): Payer: Self-pay | Admitting: *Deleted

## 2013-03-13 DIAGNOSIS — N390 Urinary tract infection, site not specified: Secondary | ICD-10-CM | POA: Diagnosis not present

## 2013-03-13 DIAGNOSIS — I10 Essential (primary) hypertension: Secondary | ICD-10-CM | POA: Diagnosis not present

## 2013-03-14 ENCOUNTER — Encounter (HOSPITAL_COMMUNITY)
Admission: RE | Admit: 2013-03-14 | Discharge: 2013-03-14 | Disposition: A | Discharge status: Home / Self Care | Payer: Medicare Other | Source: Ambulatory Visit | Attending: Internal Medicine | Admitting: Internal Medicine

## 2013-03-14 NOTE — Progress Notes (Signed)
Pt stated he was started on antibiotics yesterday for UTi,Cipro.  Called and spoke with Morrie Sheldon at Dr Tawana Scale office and orders given to rescedule pt.

## 2013-04-06 ENCOUNTER — Encounter (HOSPITAL_COMMUNITY): Payer: Medicare Other

## 2013-04-09 ENCOUNTER — Encounter (HOSPITAL_COMMUNITY)
Admission: RE | Admit: 2013-04-09 | Discharge: 2013-04-09 | Disposition: A | Discharge status: Home / Self Care | Payer: Medicare Other | Source: Ambulatory Visit | Attending: Internal Medicine | Admitting: Internal Medicine

## 2013-04-09 ENCOUNTER — Encounter (HOSPITAL_COMMUNITY): Payer: Medicare Other

## 2013-04-09 DIAGNOSIS — M069 Rheumatoid arthritis, unspecified: Secondary | ICD-10-CM | POA: Diagnosis not present

## 2013-04-09 MED ORDER — ACETAMINOPHEN 500 MG PO TABS
ORAL_TABLET | ORAL | Status: AC
  Administered 2013-04-09: 1000 mg
  Filled 2013-04-09: qty 2

## 2013-04-09 MED ORDER — SODIUM CHLORIDE 0.9 % IV SOLN
1000.0000 mg | INTRAVENOUS | Status: DC
  Administered 2013-04-09: 09:00:00 1000 mg via INTRAVENOUS
  Filled 2013-04-09: qty 100

## 2013-04-09 MED ORDER — METHYLPREDNISOLONE SODIUM SUCC 125 MG IJ SOLR
INTRAMUSCULAR | Status: AC
  Administered 2013-04-09: 100 mg
  Filled 2013-04-09: qty 2

## 2013-04-09 MED ORDER — ACETAMINOPHEN 500 MG PO TABS
ORAL_TABLET | ORAL | Status: AC
  Filled 2013-04-09: qty 2

## 2013-04-09 MED ORDER — LORATADINE 10 MG PO TABS
ORAL_TABLET | ORAL | Status: AC
  Administered 2013-04-09: 09:00:00 10 mg
  Filled 2013-04-09: qty 1

## 2013-04-09 MED ORDER — ACETAMINOPHEN 500 MG PO TABS: 1000.0000 mg | ORAL_TABLET | ORAL | Status: DC

## 2013-04-09 MED ORDER — SODIUM CHLORIDE 0.9 % IV SOLN: INTRAVENOUS | Status: DC

## 2013-04-09 MED ORDER — METHYLPREDNISOLONE SODIUM SUCC 125 MG IJ SOLR: 100.0000 mg | INTRAMUSCULAR | Status: DC

## 2013-04-09 MED ORDER — LORATADINE 10 MG PO TABS: 10.0000 mg | ORAL_TABLET | ORAL | Status: DC

## 2013-04-23 ENCOUNTER — Encounter (HOSPITAL_COMMUNITY): Payer: Medicare Other

## 2013-04-23 ENCOUNTER — Encounter (HOSPITAL_COMMUNITY)
Admission: RE | Admit: 2013-04-23 | Discharge: 2013-04-23 | Disposition: A | Discharge status: Home / Self Care | Payer: Medicare Other | Source: Ambulatory Visit | Attending: Internal Medicine | Admitting: Internal Medicine

## 2013-04-23 DIAGNOSIS — M069 Rheumatoid arthritis, unspecified: Secondary | ICD-10-CM | POA: Diagnosis not present

## 2013-04-23 MED ORDER — ACETAMINOPHEN 500 MG PO TABS
ORAL_TABLET | ORAL | Status: AC
  Administered 2013-04-23: 1000 mg via ORAL
  Filled 2013-04-23: qty 2

## 2013-04-23 MED ORDER — METHYLPREDNISOLONE SODIUM SUCC 125 MG IJ SOLR
100.0000 mg | INTRAMUSCULAR | Status: AC
  Administered 2013-04-23: 100 mg via INTRAVENOUS

## 2013-04-23 MED ORDER — LORATADINE 10 MG PO TABS
10.0000 mg | ORAL_TABLET | ORAL | Status: DC
  Filled 2013-04-23: qty 1

## 2013-04-23 MED ORDER — SODIUM CHLORIDE 0.9 % IV SOLN
INTRAVENOUS | Status: AC
  Administered 2013-04-23: 250 mL via INTRAVENOUS

## 2013-04-23 MED ORDER — METHYLPREDNISOLONE SODIUM SUCC 125 MG IJ SOLR
INTRAMUSCULAR | Status: AC
  Administered 2013-04-23: 100 mg via INTRAVENOUS
  Filled 2013-04-23: qty 2

## 2013-04-23 MED ORDER — SODIUM CHLORIDE 0.9 % IV SOLN
1000.0000 mg | INTRAVENOUS | Status: AC
  Administered 2013-04-23: 09:00:00 1000 mg via INTRAVENOUS
  Filled 2013-04-23: qty 100

## 2013-04-23 MED ORDER — ACETAMINOPHEN 500 MG PO TABS
1000.0000 mg | ORAL_TABLET | ORAL | Status: AC
  Administered 2013-04-23: 1000 mg via ORAL

## 2013-08-20 DIAGNOSIS — M069 Rheumatoid arthritis, unspecified: Secondary | ICD-10-CM | POA: Diagnosis not present

## 2013-08-20 DIAGNOSIS — L0233 Carbuncle of buttock: Secondary | ICD-10-CM | POA: Diagnosis not present

## 2013-08-22 DIAGNOSIS — L0292 Furuncle, unspecified: Secondary | ICD-10-CM | POA: Diagnosis not present

## 2013-09-14 ENCOUNTER — Other Ambulatory Visit (HOSPITAL_COMMUNITY): Payer: Self-pay | Admitting: Internal Medicine

## 2013-09-14 DIAGNOSIS — R0989 Other specified symptoms and signs involving the circulatory and respiratory systems: Secondary | ICD-10-CM

## 2013-09-14 DIAGNOSIS — I10 Essential (primary) hypertension: Secondary | ICD-10-CM | POA: Diagnosis not present

## 2013-09-14 DIAGNOSIS — C061 Malignant neoplasm of vestibule of mouth: Secondary | ICD-10-CM | POA: Diagnosis not present

## 2013-09-14 DIAGNOSIS — J8410 Pulmonary fibrosis, unspecified: Secondary | ICD-10-CM | POA: Diagnosis not present

## 2013-09-18 ENCOUNTER — Ambulatory Visit (HOSPITAL_COMMUNITY)
Admission: RE | Admit: 2013-09-18 | Discharge: 2013-09-18 | Disposition: A | Discharge status: Home / Self Care | Payer: Medicare Other | Source: Ambulatory Visit | Attending: Internal Medicine | Admitting: Internal Medicine

## 2013-09-18 DIAGNOSIS — Z87891 Personal history of nicotine dependence: Secondary | ICD-10-CM | POA: Insufficient documentation

## 2013-09-18 DIAGNOSIS — I10 Essential (primary) hypertension: Secondary | ICD-10-CM | POA: Insufficient documentation

## 2013-09-18 DIAGNOSIS — I6529 Occlusion and stenosis of unspecified carotid artery: Secondary | ICD-10-CM | POA: Insufficient documentation

## 2013-09-18 DIAGNOSIS — R0989 Other specified symptoms and signs involving the circulatory and respiratory systems: Secondary | ICD-10-CM

## 2013-09-18 DIAGNOSIS — I658 Occlusion and stenosis of other precerebral arteries: Secondary | ICD-10-CM | POA: Diagnosis not present

## 2013-09-19 DIAGNOSIS — M79609 Pain in unspecified limb: Secondary | ICD-10-CM | POA: Diagnosis not present

## 2013-09-19 DIAGNOSIS — M069 Rheumatoid arthritis, unspecified: Secondary | ICD-10-CM | POA: Diagnosis not present

## 2013-09-19 DIAGNOSIS — M159 Polyosteoarthritis, unspecified: Secondary | ICD-10-CM | POA: Diagnosis not present

## 2013-10-01 DIAGNOSIS — M069 Rheumatoid arthritis, unspecified: Secondary | ICD-10-CM | POA: Diagnosis not present

## 2013-10-15 DIAGNOSIS — M069 Rheumatoid arthritis, unspecified: Secondary | ICD-10-CM | POA: Diagnosis not present

## 2013-11-27 DIAGNOSIS — M069 Rheumatoid arthritis, unspecified: Secondary | ICD-10-CM | POA: Diagnosis not present

## 2013-11-27 DIAGNOSIS — M159 Polyosteoarthritis, unspecified: Secondary | ICD-10-CM | POA: Diagnosis not present

## 2013-12-18 DIAGNOSIS — L282 Other prurigo: Secondary | ICD-10-CM | POA: Diagnosis not present

## 2014-01-25 DIAGNOSIS — Z961 Presence of intraocular lens: Secondary | ICD-10-CM | POA: Diagnosis not present

## 2014-01-25 DIAGNOSIS — H532 Diplopia: Secondary | ICD-10-CM | POA: Diagnosis not present

## 2014-01-25 DIAGNOSIS — H02831 Dermatochalasis of right upper eyelid: Secondary | ICD-10-CM | POA: Diagnosis not present

## 2014-01-25 DIAGNOSIS — H17823 Peripheral opacity of cornea, bilateral: Secondary | ICD-10-CM | POA: Diagnosis not present

## 2014-01-25 DIAGNOSIS — H02834 Dermatochalasis of left upper eyelid: Secondary | ICD-10-CM | POA: Diagnosis not present

## 2014-02-04 DIAGNOSIS — Z23 Encounter for immunization: Secondary | ICD-10-CM | POA: Diagnosis not present

## 2014-03-11 DIAGNOSIS — E119 Type 2 diabetes mellitus without complications: Secondary | ICD-10-CM | POA: Diagnosis not present

## 2014-03-11 DIAGNOSIS — Z79899 Other long term (current) drug therapy: Secondary | ICD-10-CM | POA: Diagnosis not present

## 2014-03-11 DIAGNOSIS — M0540 Rheumatoid myopathy with rheumatoid arthritis of unspecified site: Secondary | ICD-10-CM | POA: Diagnosis not present

## 2014-03-12 DIAGNOSIS — M0589 Other rheumatoid arthritis with rheumatoid factor of multiple sites: Secondary | ICD-10-CM | POA: Diagnosis not present

## 2014-03-12 DIAGNOSIS — M150 Primary generalized (osteo)arthritis: Secondary | ICD-10-CM | POA: Diagnosis not present

## 2014-03-19 DIAGNOSIS — I10 Essential (primary) hypertension: Secondary | ICD-10-CM | POA: Diagnosis not present

## 2014-03-19 DIAGNOSIS — J8410 Pulmonary fibrosis, unspecified: Secondary | ICD-10-CM | POA: Diagnosis not present

## 2014-03-25 DIAGNOSIS — M0589 Other rheumatoid arthritis with rheumatoid factor of multiple sites: Secondary | ICD-10-CM | POA: Diagnosis not present

## 2014-09-10 DIAGNOSIS — M150 Primary generalized (osteo)arthritis: Secondary | ICD-10-CM | POA: Diagnosis not present

## 2014-09-10 DIAGNOSIS — M0589 Other rheumatoid arthritis with rheumatoid factor of multiple sites: Secondary | ICD-10-CM | POA: Diagnosis not present

## 2014-09-19 DIAGNOSIS — I10 Essential (primary) hypertension: Secondary | ICD-10-CM | POA: Diagnosis not present

## 2014-09-19 DIAGNOSIS — J8410 Pulmonary fibrosis, unspecified: Secondary | ICD-10-CM | POA: Diagnosis not present

## 2014-09-19 DIAGNOSIS — F419 Anxiety disorder, unspecified: Secondary | ICD-10-CM | POA: Diagnosis not present

## 2014-10-01 DIAGNOSIS — M0589 Other rheumatoid arthritis with rheumatoid factor of multiple sites: Secondary | ICD-10-CM | POA: Diagnosis not present

## 2014-10-15 DIAGNOSIS — M0589 Other rheumatoid arthritis with rheumatoid factor of multiple sites: Secondary | ICD-10-CM | POA: Diagnosis not present

## 2014-10-24 DIAGNOSIS — M0589 Other rheumatoid arthritis with rheumatoid factor of multiple sites: Secondary | ICD-10-CM | POA: Diagnosis not present

## 2014-10-24 DIAGNOSIS — M150 Primary generalized (osteo)arthritis: Secondary | ICD-10-CM | POA: Diagnosis not present

## 2014-10-29 DIAGNOSIS — M0589 Other rheumatoid arthritis with rheumatoid factor of multiple sites: Secondary | ICD-10-CM | POA: Diagnosis not present

## 2014-11-26 DIAGNOSIS — M0589 Other rheumatoid arthritis with rheumatoid factor of multiple sites: Secondary | ICD-10-CM | POA: Diagnosis not present

## 2014-12-24 DIAGNOSIS — M0589 Other rheumatoid arthritis with rheumatoid factor of multiple sites: Secondary | ICD-10-CM | POA: Diagnosis not present

## 2015-01-07 DIAGNOSIS — M7989 Other specified soft tissue disorders: Secondary | ICD-10-CM | POA: Diagnosis not present

## 2015-01-07 DIAGNOSIS — M79642 Pain in left hand: Secondary | ICD-10-CM | POA: Diagnosis not present

## 2015-01-07 DIAGNOSIS — M79641 Pain in right hand: Secondary | ICD-10-CM | POA: Diagnosis not present

## 2015-01-07 DIAGNOSIS — M0589 Other rheumatoid arthritis with rheumatoid factor of multiple sites: Secondary | ICD-10-CM | POA: Diagnosis not present

## 2015-01-07 DIAGNOSIS — M150 Primary generalized (osteo)arthritis: Secondary | ICD-10-CM | POA: Diagnosis not present

## 2015-01-09 DIAGNOSIS — Z23 Encounter for immunization: Secondary | ICD-10-CM | POA: Diagnosis not present

## 2015-01-21 DIAGNOSIS — M0589 Other rheumatoid arthritis with rheumatoid factor of multiple sites: Secondary | ICD-10-CM | POA: Diagnosis not present

## 2015-02-18 DIAGNOSIS — M0589 Other rheumatoid arthritis with rheumatoid factor of multiple sites: Secondary | ICD-10-CM | POA: Diagnosis not present

## 2015-02-27 DIAGNOSIS — R7989 Other specified abnormal findings of blood chemistry: Secondary | ICD-10-CM | POA: Diagnosis not present

## 2015-02-27 DIAGNOSIS — R5383 Other fatigue: Secondary | ICD-10-CM | POA: Diagnosis not present

## 2015-02-27 DIAGNOSIS — D649 Anemia, unspecified: Secondary | ICD-10-CM | POA: Diagnosis not present

## 2015-02-27 DIAGNOSIS — R7982 Elevated C-reactive protein (CRP): Secondary | ICD-10-CM | POA: Diagnosis not present

## 2015-02-27 DIAGNOSIS — E878 Other disorders of electrolyte and fluid balance, not elsewhere classified: Secondary | ICD-10-CM | POA: Diagnosis not present

## 2015-02-27 DIAGNOSIS — M069 Rheumatoid arthritis, unspecified: Secondary | ICD-10-CM | POA: Diagnosis not present

## 2015-02-27 DIAGNOSIS — M130 Polyarthritis, unspecified: Secondary | ICD-10-CM | POA: Diagnosis not present

## 2015-02-27 DIAGNOSIS — M1200 Chronic postrheumatic arthropathy [Jaccoud], unspecified site: Secondary | ICD-10-CM | POA: Diagnosis not present

## 2015-02-27 DIAGNOSIS — M790 Rheumatism, unspecified: Secondary | ICD-10-CM | POA: Diagnosis not present

## 2015-03-10 DIAGNOSIS — M79641 Pain in right hand: Secondary | ICD-10-CM | POA: Diagnosis not present

## 2015-03-10 DIAGNOSIS — M79642 Pain in left hand: Secondary | ICD-10-CM | POA: Diagnosis not present

## 2015-03-14 DIAGNOSIS — M79641 Pain in right hand: Secondary | ICD-10-CM | POA: Diagnosis not present

## 2015-03-14 DIAGNOSIS — M79642 Pain in left hand: Secondary | ICD-10-CM | POA: Diagnosis not present

## 2015-03-25 DIAGNOSIS — M0609 Rheumatoid arthritis without rheumatoid factor, multiple sites: Secondary | ICD-10-CM | POA: Diagnosis not present

## 2015-05-08 DIAGNOSIS — M130 Polyarthritis, unspecified: Secondary | ICD-10-CM | POA: Diagnosis not present

## 2015-05-13 DIAGNOSIS — D649 Anemia, unspecified: Secondary | ICD-10-CM | POA: Diagnosis not present

## 2015-05-13 DIAGNOSIS — R769 Abnormal immunological finding in serum, unspecified: Secondary | ICD-10-CM | POA: Diagnosis not present

## 2015-05-13 DIAGNOSIS — Z79899 Other long term (current) drug therapy: Secondary | ICD-10-CM | POA: Diagnosis not present

## 2015-05-13 DIAGNOSIS — M130 Polyarthritis, unspecified: Secondary | ICD-10-CM | POA: Diagnosis not present

## 2015-05-13 DIAGNOSIS — R7982 Elevated C-reactive protein (CRP): Secondary | ICD-10-CM | POA: Diagnosis not present

## 2015-05-13 DIAGNOSIS — M069 Rheumatoid arthritis, unspecified: Secondary | ICD-10-CM | POA: Diagnosis not present

## 2015-05-13 DIAGNOSIS — G629 Polyneuropathy, unspecified: Secondary | ICD-10-CM | POA: Diagnosis not present

## 2015-05-13 DIAGNOSIS — R700 Elevated erythrocyte sedimentation rate: Secondary | ICD-10-CM | POA: Diagnosis not present

## 2015-05-13 DIAGNOSIS — M0609 Rheumatoid arthritis without rheumatoid factor, multiple sites: Secondary | ICD-10-CM | POA: Diagnosis not present

## 2015-05-13 DIAGNOSIS — R760 Raised antibody titer: Secondary | ICD-10-CM | POA: Diagnosis not present

## 2015-05-13 DIAGNOSIS — E878 Other disorders of electrolyte and fluid balance, not elsewhere classified: Secondary | ICD-10-CM | POA: Diagnosis not present

## 2015-05-13 DIAGNOSIS — M1200 Chronic postrheumatic arthropathy [Jaccoud], unspecified site: Secondary | ICD-10-CM | POA: Diagnosis not present

## 2015-06-10 DIAGNOSIS — M0609 Rheumatoid arthritis without rheumatoid factor, multiple sites: Secondary | ICD-10-CM | POA: Diagnosis not present

## 2015-07-09 DIAGNOSIS — M0609 Rheumatoid arthritis without rheumatoid factor, multiple sites: Secondary | ICD-10-CM | POA: Diagnosis not present

## 2015-07-09 DIAGNOSIS — Z87891 Personal history of nicotine dependence: Secondary | ICD-10-CM | POA: Diagnosis not present

## 2015-08-06 DIAGNOSIS — M0609 Rheumatoid arthritis without rheumatoid factor, multiple sites: Secondary | ICD-10-CM | POA: Diagnosis not present

## 2015-08-06 DIAGNOSIS — R7982 Elevated C-reactive protein (CRP): Secondary | ICD-10-CM | POA: Diagnosis not present

## 2015-08-06 DIAGNOSIS — E878 Other disorders of electrolyte and fluid balance, not elsewhere classified: Secondary | ICD-10-CM | POA: Diagnosis not present

## 2015-08-06 DIAGNOSIS — R760 Raised antibody titer: Secondary | ICD-10-CM | POA: Diagnosis not present

## 2015-08-06 DIAGNOSIS — M069 Rheumatoid arthritis, unspecified: Secondary | ICD-10-CM | POA: Diagnosis not present

## 2015-08-06 DIAGNOSIS — Z79899 Other long term (current) drug therapy: Secondary | ICD-10-CM | POA: Diagnosis not present

## 2015-08-06 DIAGNOSIS — Z87891 Personal history of nicotine dependence: Secondary | ICD-10-CM | POA: Diagnosis not present

## 2015-08-06 DIAGNOSIS — R700 Elevated erythrocyte sedimentation rate: Secondary | ICD-10-CM | POA: Diagnosis not present

## 2015-08-06 DIAGNOSIS — D649 Anemia, unspecified: Secondary | ICD-10-CM | POA: Diagnosis not present

## 2015-09-03 DIAGNOSIS — Z87891 Personal history of nicotine dependence: Secondary | ICD-10-CM | POA: Diagnosis not present

## 2015-09-03 DIAGNOSIS — M0609 Rheumatoid arthritis without rheumatoid factor, multiple sites: Secondary | ICD-10-CM | POA: Diagnosis not present

## 2015-10-02 DIAGNOSIS — Z79899 Other long term (current) drug therapy: Secondary | ICD-10-CM | POA: Diagnosis not present

## 2015-10-02 DIAGNOSIS — M0589 Other rheumatoid arthritis with rheumatoid factor of multiple sites: Secondary | ICD-10-CM | POA: Diagnosis not present

## 2015-10-23 DIAGNOSIS — L298 Other pruritus: Secondary | ICD-10-CM | POA: Diagnosis not present

## 2015-10-23 DIAGNOSIS — B9689 Other specified bacterial agents as the cause of diseases classified elsewhere: Secondary | ICD-10-CM | POA: Diagnosis not present

## 2015-10-23 DIAGNOSIS — L0232 Furuncle of buttock: Secondary | ICD-10-CM | POA: Diagnosis not present

## 2015-10-30 DIAGNOSIS — M0589 Other rheumatoid arthritis with rheumatoid factor of multiple sites: Secondary | ICD-10-CM | POA: Diagnosis not present

## 2015-11-13 DIAGNOSIS — J8410 Pulmonary fibrosis, unspecified: Secondary | ICD-10-CM | POA: Diagnosis not present

## 2015-11-13 DIAGNOSIS — I10 Essential (primary) hypertension: Secondary | ICD-10-CM | POA: Diagnosis not present

## 2015-11-13 DIAGNOSIS — N390 Urinary tract infection, site not specified: Secondary | ICD-10-CM | POA: Diagnosis not present

## 2015-11-13 DIAGNOSIS — N400 Enlarged prostate without lower urinary tract symptoms: Secondary | ICD-10-CM | POA: Diagnosis not present

## 2015-11-27 DIAGNOSIS — M0589 Other rheumatoid arthritis with rheumatoid factor of multiple sites: Secondary | ICD-10-CM | POA: Diagnosis not present

## 2015-12-08 DIAGNOSIS — M0589 Other rheumatoid arthritis with rheumatoid factor of multiple sites: Secondary | ICD-10-CM | POA: Diagnosis not present

## 2015-12-08 DIAGNOSIS — M150 Primary generalized (osteo)arthritis: Secondary | ICD-10-CM | POA: Diagnosis not present

## 2015-12-10 DIAGNOSIS — L298 Other pruritus: Secondary | ICD-10-CM | POA: Diagnosis not present

## 2015-12-25 DIAGNOSIS — M0589 Other rheumatoid arthritis with rheumatoid factor of multiple sites: Secondary | ICD-10-CM | POA: Diagnosis not present

## 2015-12-25 DIAGNOSIS — Z79899 Other long term (current) drug therapy: Secondary | ICD-10-CM | POA: Diagnosis not present

## 2015-12-30 DIAGNOSIS — D649 Anemia, unspecified: Secondary | ICD-10-CM | POA: Diagnosis not present

## 2015-12-30 DIAGNOSIS — Z79899 Other long term (current) drug therapy: Secondary | ICD-10-CM | POA: Diagnosis not present

## 2015-12-30 DIAGNOSIS — R700 Elevated erythrocyte sedimentation rate: Secondary | ICD-10-CM | POA: Diagnosis not present

## 2015-12-30 DIAGNOSIS — M159 Polyosteoarthritis, unspecified: Secondary | ICD-10-CM | POA: Diagnosis not present

## 2015-12-30 DIAGNOSIS — M2550 Pain in unspecified joint: Secondary | ICD-10-CM | POA: Diagnosis not present

## 2015-12-30 DIAGNOSIS — R760 Raised antibody titer: Secondary | ICD-10-CM | POA: Diagnosis not present

## 2015-12-30 DIAGNOSIS — E878 Other disorders of electrolyte and fluid balance, not elsewhere classified: Secondary | ICD-10-CM | POA: Diagnosis not present

## 2015-12-30 DIAGNOSIS — M069 Rheumatoid arthritis, unspecified: Secondary | ICD-10-CM | POA: Diagnosis not present

## 2015-12-30 DIAGNOSIS — R7982 Elevated C-reactive protein (CRP): Secondary | ICD-10-CM | POA: Diagnosis not present

## 2015-12-30 DIAGNOSIS — R769 Abnormal immunological finding in serum, unspecified: Secondary | ICD-10-CM | POA: Diagnosis not present

## 2016-01-13 DIAGNOSIS — Z23 Encounter for immunization: Secondary | ICD-10-CM | POA: Diagnosis not present

## 2016-01-22 DIAGNOSIS — M0589 Other rheumatoid arthritis with rheumatoid factor of multiple sites: Secondary | ICD-10-CM | POA: Diagnosis not present

## 2016-02-19 DIAGNOSIS — M0589 Other rheumatoid arthritis with rheumatoid factor of multiple sites: Secondary | ICD-10-CM | POA: Diagnosis not present

## 2016-03-10 DIAGNOSIS — H35373 Puckering of macula, bilateral: Secondary | ICD-10-CM | POA: Diagnosis not present

## 2016-03-10 DIAGNOSIS — Z961 Presence of intraocular lens: Secondary | ICD-10-CM | POA: Diagnosis not present

## 2016-03-10 DIAGNOSIS — H17823 Peripheral opacity of cornea, bilateral: Secondary | ICD-10-CM | POA: Diagnosis not present

## 2016-03-10 DIAGNOSIS — H26491 Other secondary cataract, right eye: Secondary | ICD-10-CM | POA: Diagnosis not present

## 2016-03-22 DIAGNOSIS — Z79899 Other long term (current) drug therapy: Secondary | ICD-10-CM | POA: Diagnosis not present

## 2016-03-22 DIAGNOSIS — M0589 Other rheumatoid arthritis with rheumatoid factor of multiple sites: Secondary | ICD-10-CM | POA: Diagnosis not present

## 2016-04-12 DIAGNOSIS — Z6825 Body mass index (BMI) 25.0-25.9, adult: Secondary | ICD-10-CM | POA: Diagnosis not present

## 2016-04-12 DIAGNOSIS — M0589 Other rheumatoid arthritis with rheumatoid factor of multiple sites: Secondary | ICD-10-CM | POA: Diagnosis not present

## 2016-04-12 DIAGNOSIS — E663 Overweight: Secondary | ICD-10-CM | POA: Diagnosis not present

## 2016-04-12 DIAGNOSIS — M150 Primary generalized (osteo)arthritis: Secondary | ICD-10-CM | POA: Diagnosis not present

## 2016-04-21 DIAGNOSIS — M0589 Other rheumatoid arthritis with rheumatoid factor of multiple sites: Secondary | ICD-10-CM | POA: Diagnosis not present

## 2016-05-18 ENCOUNTER — Ambulatory Visit (HOSPITAL_COMMUNITY)
Admission: RE | Admit: 2016-05-18 | Discharge: 2016-05-18 | Disposition: A | Discharge status: Home / Self Care | Payer: Medicare Other | Source: Ambulatory Visit | Attending: Internal Medicine | Admitting: Internal Medicine

## 2016-05-18 ENCOUNTER — Other Ambulatory Visit (HOSPITAL_COMMUNITY): Payer: Self-pay | Admitting: Internal Medicine

## 2016-05-18 DIAGNOSIS — Z6826 Body mass index (BMI) 26.0-26.9, adult: Secondary | ICD-10-CM | POA: Diagnosis not present

## 2016-05-18 DIAGNOSIS — M059 Rheumatoid arthritis with rheumatoid factor, unspecified: Secondary | ICD-10-CM | POA: Diagnosis not present

## 2016-05-18 DIAGNOSIS — J8410 Pulmonary fibrosis, unspecified: Secondary | ICD-10-CM

## 2016-05-18 DIAGNOSIS — I10 Essential (primary) hypertension: Secondary | ICD-10-CM | POA: Diagnosis not present

## 2016-05-19 DIAGNOSIS — M0589 Other rheumatoid arthritis with rheumatoid factor of multiple sites: Secondary | ICD-10-CM | POA: Diagnosis not present

## 2016-05-27 ENCOUNTER — Telehealth: Payer: Self-pay

## 2016-05-27 NOTE — Telephone Encounter (Signed)
Attempted to schedule consultation for referral from Dr. Ria Comment office .  No ans no vm .  Patient had previous appt with [facility] office but this was cancelled today due to "cx per referring provider" .  Unclear if this is due to wanting [place] office appointment or if the referring md decided patient does not need this appointment.  Attempted to contact office of [place] but no ans.  Will call again at a later time.

## 2016-05-28 NOTE — Telephone Encounter (Signed)
Per msg stanley dalton at [place] office this consult was cancelled.  No need to schedule an appointment at this time.

## 2016-06-16 DIAGNOSIS — M0589 Other rheumatoid arthritis with rheumatoid factor of multiple sites: Secondary | ICD-10-CM | POA: Diagnosis not present

## 2016-06-18 ENCOUNTER — Institutional Professional Consult (permissible substitution): Payer: Medicare Other | Admitting: Internal Medicine

## 2016-07-12 DIAGNOSIS — M0589 Other rheumatoid arthritis with rheumatoid factor of multiple sites: Secondary | ICD-10-CM | POA: Diagnosis not present

## 2016-07-12 DIAGNOSIS — M150 Primary generalized (osteo)arthritis: Secondary | ICD-10-CM | POA: Diagnosis not present

## 2016-07-12 DIAGNOSIS — Z6825 Body mass index (BMI) 25.0-25.9, adult: Secondary | ICD-10-CM | POA: Diagnosis not present

## 2016-07-14 DIAGNOSIS — M0589 Other rheumatoid arthritis with rheumatoid factor of multiple sites: Secondary | ICD-10-CM | POA: Diagnosis not present

## 2016-07-21 ENCOUNTER — Encounter: Payer: Self-pay | Admitting: Internal Medicine

## 2016-07-21 ENCOUNTER — Ambulatory Visit (INDEPENDENT_AMBULATORY_CARE_PROVIDER_SITE_OTHER): Payer: Medicare Other | Admitting: Internal Medicine

## 2016-07-21 VITALS — BP 122/72 | HR 62 | Ht 69.0 in | Wt 167.2 lb

## 2016-07-21 DIAGNOSIS — J8410 Pulmonary fibrosis, unspecified: Secondary | ICD-10-CM | POA: Diagnosis not present

## 2016-07-21 NOTE — Patient Instructions (Signed)
GERD (REFLUX)  is an extremely common cause of respiratory symptoms just like yours , many times with no obvious heartburn at all.    It can be treated with medication, but also with lifestyle changes including elevation of the head of your bed (ideally with 6 inch  bed blocks),  Smoking cessation, avoidance of late meals, excessive alcohol, and avoid fatty foods, chocolate, peppermint, colas, red wine, and acidic juices such as orange juice.  NO MINT OR MENTHOL PRODUCTS SO NO COUGH DROPS   USE SUGARLESS CANDY INSTEAD (Jolley ranchers or Stover's or Life Savers) or even ice chips will also do - the key is to swallow to prevent all throat clearing. NO OIL BASED VITAMINS - use powdered substitutes.  If cough gets worse the first step is:  Try prilosec otc 20mg   Take 30-60 min before first meal of the day and Pepcid ac (famotidine) 20 mg one @  bedtime until cough is completely gone for at least a week without the need for cough suppression        Please schedule a follow up visit in 3 months but call sooner if needed with pfts on return

## 2016-07-21 NOTE — Assessment & Plan Note (Signed)
-   CT chest 09/30/2009    - PFT's November 27, 2009 FEV1 2.71 (98%) ratio 67%  FVC 3.59 (85%)   DLC0 77% . corrects to 97% - 07/21/2016  Walked RA x 3 laps @ 185 ft each stopped due to  End of study, nl pace, no sob or desat     DDx for pulmonary fibrosis  includes idiopathic pulmonary fibrosis, pulmonary fibrosis associated with rheumatologic diseases (which have a relatively benign course in most cases) , adverse effect from  drugs such as chemotherapy or amiodarone exposure, nonspecific interstitial pneumonia which is typically steroid responsive, and chronic hypersensitivity pneumonitis.   In active  smokers Langerhan's Cell  Histiocyctosis (eosinophilic granuomatosis),  DIP,  and Respiratory Bronchiolitis ILD also need to be considered,  But no longer apply here   This is almost certainly RA lung and all that is needed is control of systemic dz and f/u with pfts and serial 02 sats with exertion   In event cough worsens : Use of PPI is associated with improved survival time and with decreased radiologic fibrosis per King's study published in AJRCCM vol 184 p1390.  Dec 2011 and also may have other beneficial effects as per the latest review in [place] vol 193 W2376 Jun 20016.  This may not always be cause and effect, but given how universally unimpressive and expensive  all the other  Drugs developed to day  have been for pf,   rec for flare of cough or sob start  rx ppi / diet/ lifestyle modification and f/u with serial walking sats and lung volumes for now to put more points on the curve / establish firm baseline before considering additional measures.   Total time devoted to counseling  > 50 % of initial 60 min office visit:  review case with pt/ discussion of options/alternatives/ personally creating written customized instructions  in presence of pt  then going over those specific  Instructions directly with the pt including how to use all of the meds but in particular covering each new medication  in detail and the difference between the maintenance= "automatic" meds and the prns using an action plan format for the latter (If this problem/symptom => do that organization reading Left to right).  Please see AVS from this visit for a full list of these instructions which I personally wrote for this pt and  are unique to this visit.

## 2016-07-21 NOTE — Progress Notes (Signed)
Subjective:    Patient ID: Patrick Armstrong, male    DOB: 1932-01-15,    MRN: [id number]  HPI  47 yowm quit smoking 1965 with cough that resolved then  with RA dx'd in 2001 followed by Dr Amil Amen with remote rx with MTX on biologics since 2003 IV once a month referred to pulmonary clinic 07/21/2016 by Dr   Willey Blade for PF p previous eval here in 11/2009 with PF assoc  With RA and no evidence of mtx lung and mtx stopped "about the same time" per pt and does not recall last exposure     07/21/2016 1st [facility] office visit/ Patrick Armstrong   Chief Complaint  Patient presents with  . pulmonary Consult    Referred by Dr. Asencion Noble for Pulmonary Fibrosis, Pt does not feel like he has any breathing complaints   Not limited by breathing from desired activities  But some cough daily x years minimally productive variable daytime  No noct cough  Feels arthritis controlled ok at present   No obvious day to day or daytime variability or assoc excess/ purulent sputum or mucus plugs or hemoptysis or cp or chest tightness, subjective wheeze or overt sinus or hb symptoms. No unusual exp hx or h/o childhood pna/ asthma or knowledge of premature birth.  Sleeping ok without nocturnal  or early am exacerbation  of respiratory  c/o's or need for noct saba. Also denies any obvious fluctuation of symptoms with weather or environmental changes or other aggravating or alleviating factors except as outlined above   Current Medications, Allergies, Complete Past Medical History, Past Surgical History, Family History, and Social History were reviewed in [employer] record.      Past Medical History: THROMBOCYTOPENIA (ICD-287.5) HYPERTENSION (ICD-401.9) HYPOKALEMIA (ICD-276.8) PULMONARY FIBROSIS (ICD-515)    - CT chest 09/30/2009    - PFT's November 27, 2009 FEV1 2.71 (98%) ratio 67%  FVC 3.59 (85%)   DLC0 77% . corrects to 97% ARTHRITIS, RHEUMATOID (ICD-714.0)..............................Marland KitchenDr  Amil Amen       - Stopped all rx on September 29, 2009 due to fuo  Review of Systems  Constitutional: Negative.  Negative for fever and unexpected weight change.  HENT: Positive for sneezing. Negative for congestion, dental problem, ear pain, nosebleeds, postnasal drip, rhinorrhea, sinus pressure, sore throat and trouble swallowing.   Eyes: Positive for itching. Negative for redness.  Respiratory: Positive for cough. Negative for chest tightness, shortness of breath and wheezing.   Cardiovascular: Negative.  Negative for palpitations and leg swelling.  Gastrointestinal: Negative.  Negative for nausea and vomiting.  Endocrine: Negative.   Genitourinary: Negative.  Negative for dysuria.  Musculoskeletal: Positive for joint swelling.  Skin: Negative.  Negative for rash.  Allergic/Immunologic: Negative.  Negative for environmental allergies, food allergies and immunocompromised state.  Neurological: Negative.  Negative for headaches.  Hematological: Negative.  Does not bruise/bleed easily.  Psychiatric/Behavioral: Negative.  Negative for dysphoric mood. The patient is not nervous/anxious.        Objective:   Physical Exam   amb pleasant wm nad  Wt Readings from Last 3 Encounters:  07/21/16 167 lb 3.2 oz (75.8 kg)  04/23/13 165 lb (74.8 kg)  04/09/13 165 lb (74.8 kg)    Vital signs reviewed  - Note on arrival 02 sats  96% on RA     HEENT: nl  turbinates bilaterally, and oropharynx. Nl external ear canals without cough reflex - edentulous    NECK :  without JVD/Nodes/TM/ nl  carotid upstrokes bilaterally   LUNGS: no acc muscle use,  Nl contour chest with insp crackles diffusely but not cough on insp maneuvers   CV:  RRR  no s3 or murmur or increase in P2, and no edema   ABD:  soft and nontender with nl inspiratory excursion in the supine position. No bruits or organomegaly appreciated, bowel sounds nl  MS:  Nl gait/ ext warm with R index finger amp at PIP  , calf tenderness, cyanosis  or clubbing Mild / mod RA changes   SKIN: warm and dry without lesions    NEURO:  alert, approp, nl sensorium with  no motor or cerebellar deficits apparent.      I personally reviewed images and agree with radiology impression as follows:  CXR:   05/18/16 .       Assessment & Plan:

## 2016-08-11 DIAGNOSIS — M0589 Other rheumatoid arthritis with rheumatoid factor of multiple sites: Secondary | ICD-10-CM | POA: Diagnosis not present

## 2016-09-08 DIAGNOSIS — M0589 Other rheumatoid arthritis with rheumatoid factor of multiple sites: Secondary | ICD-10-CM | POA: Diagnosis not present

## 2016-10-06 DIAGNOSIS — Z79899 Other long term (current) drug therapy: Secondary | ICD-10-CM | POA: Diagnosis not present

## 2016-10-06 DIAGNOSIS — M0589 Other rheumatoid arthritis with rheumatoid factor of multiple sites: Secondary | ICD-10-CM | POA: Diagnosis not present

## 2016-10-11 DIAGNOSIS — L304 Erythema intertrigo: Secondary | ICD-10-CM | POA: Diagnosis not present

## 2016-10-11 DIAGNOSIS — L298 Other pruritus: Secondary | ICD-10-CM | POA: Diagnosis not present

## 2016-10-12 DIAGNOSIS — M150 Primary generalized (osteo)arthritis: Secondary | ICD-10-CM | POA: Diagnosis not present

## 2016-10-12 DIAGNOSIS — Z6824 Body mass index (BMI) 24.0-24.9, adult: Secondary | ICD-10-CM | POA: Diagnosis not present

## 2016-10-12 DIAGNOSIS — M0589 Other rheumatoid arthritis with rheumatoid factor of multiple sites: Secondary | ICD-10-CM | POA: Diagnosis not present

## 2016-10-21 ENCOUNTER — Encounter: Payer: Self-pay | Admitting: Internal Medicine

## 2016-10-21 ENCOUNTER — Ambulatory Visit (INDEPENDENT_AMBULATORY_CARE_PROVIDER_SITE_OTHER): Payer: Medicare Other | Admitting: Internal Medicine

## 2016-10-21 VITALS — BP 122/80 | HR 60 | Ht 69.0 in | Wt 169.0 lb

## 2016-10-21 DIAGNOSIS — R05 Cough: Secondary | ICD-10-CM

## 2016-10-21 DIAGNOSIS — J8410 Pulmonary fibrosis, unspecified: Secondary | ICD-10-CM | POA: Diagnosis not present

## 2016-10-21 DIAGNOSIS — R053 Chronic cough: Secondary | ICD-10-CM | POA: Insufficient documentation

## 2016-10-21 LAB — PULMONARY FUNCTION TEST
DL/VA % PRED: 85 %
DL/VA: 3.83 ml/min/mmHg/L
DLCO cor % pred: 55 %
DLCO cor: 17.13 ml/min/mmHg
DLCO unc % pred: 52 %
DLCO unc: 16.3 ml/min/mmHg
FEF 25-75 PRE: 2.14 L/s
FEF 25-75 Post: 2.71 L/sec
FEF2575-%CHANGE-POST: 26 %
FEF2575-%Pred-Post: 157 %
FEF2575-%Pred-Pre: 124 %
FEV1-%Change-Post: 5 %
FEV1-%PRED-PRE: 90 %
FEV1-%Pred-Post: 95 %
FEV1-PRE: 2.35 L
FEV1-Post: 2.49 L
FEV1FVC-%Change-Post: 2 %
FEV1FVC-%Pred-Pre: 111 %
FEV6-%CHANGE-POST: 2 %
FEV6-%PRED-PRE: 85 %
FEV6-%Pred-Post: 88 %
FEV6-PRE: 2.95 L
FEV6-Post: 3.04 L
FEV6FVC-%Change-Post: 0 %
FEV6FVC-%PRED-PRE: 107 %
FEV6FVC-%Pred-Post: 107 %
FVC-%Change-Post: 2 %
FVC-%PRED-PRE: 79 %
FVC-%Pred-Post: 81 %
FVC-Post: 3.04 L
FVC-Pre: 2.96 L
POST FEV6/FVC RATIO: 100 %
Post FEV1/FVC ratio: 82 %
Pre FEV1/FVC ratio: 79 %
Pre FEV6/FVC Ratio: 100 %
RV % pred: 70 %
RV: 1.88 L
TLC % pred: 69 %
TLC: 4.8 L

## 2016-10-21 NOTE — Patient Instructions (Addendum)
GERD (REFLUX)  is an extremely common cause of respiratory symptoms just like yours , many times with no obvious heartburn at all.    It can be treated with medication, but also with lifestyle changes including elevation of the head of your bed (ideally with 6 inch  bed blocks),  Smoking cessation, avoidance of late meals, excessive alcohol, and avoid fatty foods, chocolate, peppermint, colas, red wine, and acidic juices such as orange juice.  NO MINT OR MENTHOL PRODUCTS SO NO COUGH DROPS   USE SUGARLESS CANDY INSTEAD (Jolley ranchers or Stover's or Life Savers) or even ice chips will also do - the key is to swallow to prevent all throat clearing. NO OIL BASED VITAMINS - use powdered substitutes.  If cough gets worse the first step is:  Try prilosec otc 20mg   Take 30-60 min before first meal of the day and Pepcid ac (famotidine) 20 mg one @  bedtime until cough is completely gone for at least a week without the need for cough suppression         If you are satisfied with your treatment plan,  let your doctor know and he/she can either refill your medications or you can return here when your prescription runs out.     If in any way you are not 100% satisfied,  please tell us.  If 100% better, tell your friends!  Pulmonary follow up is as needed

## 2016-10-21 NOTE — Assessment & Plan Note (Signed)
Minimal at his point but advised pt:  Of the three most common causes of  Sub-acute or recurrent or chronic cough, only one (GERD)  can actually contribute to/ trigger  the other two (asthma and post nasal drip syndrome)  and perpetuate the cylce of cough.  While not intuitively obvious, many patients with chronic low grade reflux do not cough until there is a primary insult that disturbs the protective epithelial barrier and exposes sensitive nerve endings.   This is typically viral but can be direct physical injury such as with an endotracheal tube.   The point is that once this occurs, it is difficult to eliminate the cycle  using anything but a maximally effective acid suppression regimen at least in the short run, accompanied by an appropriate diet to address non acid GERD which can be  Accomplished by otcs and diet and f/u here prn   see avs for instructions unique to this ov

## 2016-10-21 NOTE — Progress Notes (Signed)
PFT done today. 

## 2016-10-21 NOTE — Progress Notes (Signed)
Subjective:    Patient ID: Patrick Armstrong, male    DOB: Nov 01, 1931,    MRN: [id number]  HPI  29 yowm quit smoking 1965 with cough that resolved then  with RA dx'd in 2001 followed by Dr Amil Amen with remote rx with MTX on biologics since 2003 IV once a month referred to pulmonary clinic 07/21/2016 by Dr   Willey Blade for PF p previous eval here in 11/2009 with PF assoc  With RA and no evidence of mtx lung and mtx stopped "about the same time" per pt and does not recall last exposure     07/21/2016 1st [facility] office visit/ Peytyn Trine   Chief Complaint  Patient presents with  . pulmonary Consult    Referred by Dr. Asencion Noble for Pulmonary Fibrosis, Pt does not feel like he has any breathing complaints   Not limited by breathing from desired activities  But some cough daily x years minimally productive variable daytime  No noct cough  Feels arthritis controlled ok at present  rec GERD (REFLUX)    Diet  If cough gets worse the first step is:  Try prilosec otc 20mg   Take 30-60 min before first meal of the day and Pepcid ac (famotidine) 20 mg one @  bedtime until cough is completely gone for at least a week without the need for cough suppression     10/21/2016  f/u ov/Ashlei Chinchilla re:  Chief Complaint  Patient presents with  . Follow-up    PFT's done today. He states his breathing is overall doing well.     never took gerd rx/ cough not bothersome/ Not limited by breathing from desired activities  But by arhtritis (beekman)  No obvious day to day or daytime variability or assoc excess/ purulent sputum or mucus plugs or hemoptysis or cp or chest tightness, subjective wheeze or overt sinus or hb symptoms. No unusual exp hx or h/o childhood pna/ asthma or knowledge of premature birth.  Sleeping ok without nocturnal  or early am exacerbation  of respiratory  c/o's or need for noct saba. Also denies any obvious fluctuation of symptoms with weather or environmental changes or other aggravating or  alleviating factors except as outlined above   Current Medications, Allergies, Complete Past Medical History, Past Surgical History, Family History, and Social History were reviewed in [employer] record.  ROS  The following are not active complaints unless bolded sore throat, dysphagia, dental problems, itching, sneezing,  nasal congestion or excess/ purulent secretions, ear ache,   fever, chills, sweats, unintended wt loss, classically pleuritic or exertional cp,  orthopnea pnd or leg swelling, presyncope, palpitations, abdominal pain, anorexia, nausea, vomiting, diarrhea  or change in bowel or bladder habits, change in stools or urine, dysuria,hematuria,  rash, arthralgias, visual complaints, headache, numbness, weakness or ataxia or problems with walking or coordination,  change in mood/affect or memory.          Past Medical History: THROMBOCYTOPENIA (ICD-287.5) HYPERTENSION (ICD-401.9) HYPOKALEMIA (ICD-276.8) PULMONARY FIBROSIS (ICD-515)    - CT chest 09/30/2009    - PFT's November 27, 2009 FEV1 2.71 (98%) ratio 67%  FVC 3.59 (85%)   DLC0 77% . corrects to 97% ARTHRITIS, RHEUMATOID (ICD-714.0)..............................Marland KitchenDr Amil Amen       - Stopped all rx on September 29, 2009 due to fuo        Objective:   Physical Exam   amb pleasant wm nad  10/21/2016        169  07/21/16 167 lb 3.2 oz (75.8 kg)  04/23/13 165 lb (74.8 kg)  04/09/13 165 lb (74.8 kg)    Vital signs reviewed  - Note on arrival 02 sats  94% on RA     HEENT: nl  turbinates bilaterally, and oropharynx. Nl external ear canals without cough reflex - edentulous    NECK :  without JVD/Nodes/TM/ nl carotid upstrokes bilaterally   LUNGS: no acc muscle use,  Nl contour chest with insp crackles diffusely but not cough on insp maneuvers   CV:  RRR  no s3 or murmur or increase in P2, and no edema   ABD:  soft and nontender with nl inspiratory excursion in the supine position. No bruits or  organomegaly appreciated, bowel sounds nl  MS:  Nl gait/ ext warm with R index finger amp at PIP  , calf tenderness, cyanosis or clubbing Mild / mod RA changes   SKIN: warm and dry without lesions    NEURO:  alert, approp, nl sensorium with  no motor or cerebellar deficits apparent.      I personally reviewed images and agree with radiology impression as follows:  CXR:   05/18/16 Severe pulmonary fibrosis.  No acute findings.       Assessment & Plan:

## 2016-11-03 DIAGNOSIS — M0589 Other rheumatoid arthritis with rheumatoid factor of multiple sites: Secondary | ICD-10-CM | POA: Diagnosis not present

## 2016-11-19 DIAGNOSIS — M059 Rheumatoid arthritis with rheumatoid factor, unspecified: Secondary | ICD-10-CM | POA: Diagnosis not present

## 2016-11-19 DIAGNOSIS — Z23 Encounter for immunization: Secondary | ICD-10-CM | POA: Diagnosis not present

## 2016-11-19 DIAGNOSIS — J84112 Idiopathic pulmonary fibrosis: Secondary | ICD-10-CM | POA: Diagnosis not present

## 2016-11-19 DIAGNOSIS — I10 Essential (primary) hypertension: Secondary | ICD-10-CM | POA: Diagnosis not present

## 2016-12-01 DIAGNOSIS — M0589 Other rheumatoid arthritis with rheumatoid factor of multiple sites: Secondary | ICD-10-CM | POA: Diagnosis not present

## 2016-12-06 DIAGNOSIS — T25031A Burn of unspecified degree of right toe(s) (nail), initial encounter: Secondary | ICD-10-CM | POA: Diagnosis not present

## 2016-12-29 DIAGNOSIS — Z79899 Other long term (current) drug therapy: Secondary | ICD-10-CM | POA: Diagnosis not present

## 2016-12-29 DIAGNOSIS — M0589 Other rheumatoid arthritis with rheumatoid factor of multiple sites: Secondary | ICD-10-CM | POA: Diagnosis not present

## 2017-01-14 DIAGNOSIS — Z23 Encounter for immunization: Secondary | ICD-10-CM | POA: Diagnosis not present

## 2017-01-26 DIAGNOSIS — M0589 Other rheumatoid arthritis with rheumatoid factor of multiple sites: Secondary | ICD-10-CM | POA: Diagnosis not present

## 2017-02-11 DIAGNOSIS — Z6824 Body mass index (BMI) 24.0-24.9, adult: Secondary | ICD-10-CM | POA: Diagnosis not present

## 2017-02-11 DIAGNOSIS — M150 Primary generalized (osteo)arthritis: Secondary | ICD-10-CM | POA: Diagnosis not present

## 2017-02-11 DIAGNOSIS — M0589 Other rheumatoid arthritis with rheumatoid factor of multiple sites: Secondary | ICD-10-CM | POA: Diagnosis not present

## 2017-02-23 DIAGNOSIS — M0589 Other rheumatoid arthritis with rheumatoid factor of multiple sites: Secondary | ICD-10-CM | POA: Diagnosis not present

## 2017-03-23 DIAGNOSIS — Z79899 Other long term (current) drug therapy: Secondary | ICD-10-CM | POA: Diagnosis not present

## 2017-03-23 DIAGNOSIS — M0589 Other rheumatoid arthritis with rheumatoid factor of multiple sites: Secondary | ICD-10-CM | POA: Diagnosis not present

## 2017-04-17 ENCOUNTER — Emergency Department (HOSPITAL_COMMUNITY): Payer: Medicare Other

## 2017-04-17 ENCOUNTER — Other Ambulatory Visit: Payer: Self-pay

## 2017-04-17 ENCOUNTER — Emergency Department (HOSPITAL_COMMUNITY)
Admission: EM | Admit: 2017-04-17 | Discharge: 2017-04-26 | Disposition: E | Payer: Medicare Other | Attending: Emergency Medicine | Admitting: Emergency Medicine

## 2017-04-17 ENCOUNTER — Encounter (HOSPITAL_COMMUNITY): Payer: Self-pay | Admitting: *Deleted

## 2017-04-17 DIAGNOSIS — I7103 Dissection of thoracoabdominal aorta: Secondary | ICD-10-CM | POA: Diagnosis not present

## 2017-04-17 DIAGNOSIS — Z452 Encounter for adjustment and management of vascular access device: Secondary | ICD-10-CM | POA: Diagnosis not present

## 2017-04-17 DIAGNOSIS — I10 Essential (primary) hypertension: Secondary | ICD-10-CM | POA: Insufficient documentation

## 2017-04-17 DIAGNOSIS — Z87891 Personal history of nicotine dependence: Secondary | ICD-10-CM | POA: Insufficient documentation

## 2017-04-17 DIAGNOSIS — K5730 Diverticulosis of large intestine without perforation or abscess without bleeding: Secondary | ICD-10-CM | POA: Diagnosis not present

## 2017-04-17 DIAGNOSIS — R0789 Other chest pain: Secondary | ICD-10-CM | POA: Diagnosis not present

## 2017-04-17 DIAGNOSIS — Z7982 Long term (current) use of aspirin: Secondary | ICD-10-CM | POA: Diagnosis not present

## 2017-04-17 DIAGNOSIS — M545 Low back pain: Secondary | ICD-10-CM | POA: Diagnosis not present

## 2017-04-17 DIAGNOSIS — I7102 Dissection of abdominal aorta: Secondary | ICD-10-CM | POA: Diagnosis not present

## 2017-04-17 DIAGNOSIS — R918 Other nonspecific abnormal finding of lung field: Secondary | ICD-10-CM | POA: Diagnosis not present

## 2017-04-17 DIAGNOSIS — Z79899 Other long term (current) drug therapy: Secondary | ICD-10-CM | POA: Diagnosis not present

## 2017-04-17 DIAGNOSIS — M546 Pain in thoracic spine: Secondary | ICD-10-CM | POA: Diagnosis not present

## 2017-04-17 HISTORY — DX: Unspecified osteoarthritis, unspecified site: M19.90

## 2017-04-17 HISTORY — DX: Essential (primary) hypertension: I10

## 2017-04-17 HISTORY — DX: Pulmonary fibrosis, unspecified: J84.10

## 2017-04-17 LAB — TYPE AND SCREEN
UNIT DIVISION: 0
UNIT DIVISION: 0
UNIT DIVISION: 0
Unit division: 0

## 2017-04-17 LAB — BPAM FFP
BLOOD PRODUCT EXPIRATION DATE: 201901012359
BLOOD PRODUCT EXPIRATION DATE: 201901012359
Blood Product Expiration Date: 201901012359
Blood Product Expiration Date: 201901012359
ISSUE DATE / TIME: 201812230846
ISSUE DATE / TIME: 201812230846
ISSUE DATE / TIME: 201812230846
ISSUE DATE / TIME: 201812230846
UNIT TYPE AND RH: 6200
Unit Type and Rh: 6200
Unit Type and Rh: 6200
Unit Type and Rh: 6200

## 2017-04-17 LAB — PREPARE FRESH FROZEN PLASMA
UNIT DIVISION: 0
Unit division: 0
Unit division: 0
Unit division: 0

## 2017-04-17 LAB — CBC WITH DIFFERENTIAL/PLATELET
BASOS ABS: 0.2 10*3/uL — AB (ref 0.0–0.1)
Basophils Relative: 1 %
Eosinophils Absolute: 0.5 10*3/uL (ref 0.0–0.7)
Eosinophils Relative: 3 %
HCT: 38.2 % — ABNORMAL LOW (ref 39.0–52.0)
Hemoglobin: 12.6 g/dL — ABNORMAL LOW (ref 13.0–17.0)
Lymphocytes Relative: 12 %
Lymphs Abs: 2.2 10*3/uL (ref 0.7–4.0)
MCH: 30.7 pg (ref 26.0–34.0)
MCHC: 33 g/dL (ref 30.0–36.0)
MCV: 92.9 fL (ref 78.0–100.0)
MONO ABS: 1.6 10*3/uL — AB (ref 0.1–1.0)
Monocytes Relative: 9 %
NEUTROS ABS: 13.8 10*3/uL — AB (ref 1.7–7.7)
Neutrophils Relative %: 75 %
Platelets: 170 10*3/uL (ref 150–400)
RBC: 4.11 MIL/uL — ABNORMAL LOW (ref 4.22–5.81)
RDW: 12.9 % (ref 11.5–15.5)
WBC: 18.3 10*3/uL — ABNORMAL HIGH (ref 4.0–10.5)

## 2017-04-17 LAB — BPAM RBC
BLOOD PRODUCT EXPIRATION DATE: 201901092359
Blood Product Expiration Date: 201812302359
Blood Product Expiration Date: 201901052359
Blood Product Expiration Date: 201901102359
ISSUE DATE / TIME: 201812230844
ISSUE DATE / TIME: 201812230844
ISSUE DATE / TIME: 201812230844
ISSUE DATE / TIME: 201812230844
UNIT TYPE AND RH: 9500
UNIT TYPE AND RH: 9500
UNIT TYPE AND RH: 9500
Unit Type and Rh: 9500

## 2017-04-17 LAB — COMPREHENSIVE METABOLIC PANEL
ALBUMIN: 3.4 g/dL — AB (ref 3.5–5.0)
ALT: 31 U/L (ref 17–63)
ANION GAP: 13 (ref 5–15)
AST: 31 U/L (ref 15–41)
Alkaline Phosphatase: 38 U/L (ref 38–126)
BUN: 22 mg/dL — ABNORMAL HIGH (ref 6–20)
CO2: 23 mmol/L (ref 22–32)
Calcium: 8.6 mg/dL — ABNORMAL LOW (ref 8.9–10.3)
Chloride: 103 mmol/L (ref 101–111)
Creatinine, Ser: 1.35 mg/dL — ABNORMAL HIGH (ref 0.61–1.24)
GFR calc Af Amer: 54 mL/min — ABNORMAL LOW (ref >60)
GFR calc non Af Amer: 47 mL/min — ABNORMAL LOW (ref >60)
Glucose, Bld: 212 mg/dL — ABNORMAL HIGH (ref 65–99)
POTASSIUM: 2.9 mmol/L — AB (ref 3.5–5.1)
Sodium: 139 mmol/L (ref 135–145)
TOTAL PROTEIN: 5.9 g/dL — AB (ref 6.5–8.1)
Total Bilirubin: 0.7 mg/dL (ref 0.3–1.2)

## 2017-04-17 LAB — LIPASE, BLOOD: Lipase: 42 U/L (ref 11–51)

## 2017-04-17 LAB — PREPARE RBC (CROSSMATCH)

## 2017-04-17 LAB — ABO/RH: ABO/RH(D): B POS

## 2017-04-17 MED ORDER — NOREPINEPHRINE BITARTRATE 1 MG/ML IV SOLN
10.0000 ug/kg/min | Freq: Once | INTRAVENOUS | Status: AC
  Administered 2017-04-17: 37.54 ug/mL via INTRAVENOUS

## 2017-04-17 MED ORDER — MIDAZOLAM HCL 2 MG/2ML IJ SOLN
INTRAMUSCULAR | Status: AC
  Filled 2017-04-17: qty 4

## 2017-04-17 MED ORDER — IOPAMIDOL (ISOVUE-370) INJECTION 76%
100.0000 mL | Freq: Once | INTRAVENOUS | Status: AC | PRN
  Administered 2017-04-17: 100 mL via INTRAVENOUS

## 2017-04-17 MED ORDER — EPINEPHRINE PF 1 MG/10ML IJ SOSY
PREFILLED_SYRINGE | INTRAMUSCULAR | Status: AC | PRN
  Administered 2017-04-17 (×2): 1 via INTRAVENOUS

## 2017-04-17 MED ORDER — MIDAZOLAM HCL 5 MG/5ML IJ SOLN
INTRAMUSCULAR | Status: AC | PRN
  Administered 2017-04-17: 2 mg via INTRAVENOUS

## 2017-04-18 LAB — TYPE AND SCREEN
ABO/RH(D): B POS
Antibody Screen: NEGATIVE
UNIT DIVISION: 0
Unit division: 0

## 2017-04-18 LAB — BPAM RBC
BLOOD PRODUCT EXPIRATION DATE: 201901172359
Blood Product Expiration Date: 201901172359
ISSUE DATE / TIME: 201812230725
UNIT TYPE AND RH: 9500
Unit Type and Rh: 9500

## 2017-04-20 LAB — I-STAT TROPONIN, ED: TROPONIN I, POC: 0 ng/mL (ref 0.00–0.08)

## 2017-04-20 LAB — I-STAT CHEM 8, ED
BUN: 20 mg/dL (ref 6–20)
CALCIUM ION: 1.04 mmol/L — AB (ref 1.15–1.40)
Chloride: 104 mmol/L (ref 101–111)
Creatinine, Ser: 1.3 mg/dL — ABNORMAL HIGH (ref 0.61–1.24)
Glucose, Bld: 208 mg/dL — ABNORMAL HIGH (ref 65–99)
HCT: 37 % — ABNORMAL LOW (ref 39.0–52.0)
Hemoglobin: 12.6 g/dL — ABNORMAL LOW (ref 13.0–17.0)
Potassium: 2.8 mmol/L — ABNORMAL LOW (ref 3.5–5.1)
SODIUM: 142 mmol/L (ref 135–145)
TCO2: 25 mmol/L (ref 22–32)

## 2017-04-20 LAB — I-STAT CG4 LACTIC ACID, ED: LACTIC ACID, VENOUS: 5.08 mmol/L — AB (ref 0.5–1.9)

## 2017-04-21 MED FILL — Norepinephrine Bitartrate IV Soln 1 MG/ML (Base Equivalent): INTRAVENOUS | Qty: 4 | Status: AC

## 2017-04-21 MED FILL — Dextrose Inj 5%: INTRAVENOUS | Qty: 250 | Status: AC

## 2017-04-26 NOTE — ED Triage Notes (Signed)
Pt arrived by EMS from home. Pt states started having pain in the center of his chest when he went to get out of bed. Pt says pain went back & into his abdomen. Pt vomited w/ EMS was given 4 mg of zofran & 324 mg of ASA.

## 2017-04-26 NOTE — ED Notes (Signed)
Lab called to bring over 2 units of emergency release blood.

## 2017-04-26 NOTE — ED Notes (Signed)
Dr. Roxan Hockey at bedside for CVTS.

## 2017-04-26 NOTE — ED Notes (Signed)
Chaplain advised pts family could come to bedside

## 2017-04-26 NOTE — ED Notes (Signed)
Patient arrived by [facility] from [facility] ED for CVTS following dissection. Patient arrived with Levophed infusing and NS. Central line out when patient moved to stretcher. Dr. Sabra Heck at bedside on patients arrival with RT.

## 2017-04-26 NOTE — ED Notes (Signed)
Pt's son Darryll [phone]

## 2017-04-26 NOTE — Progress Notes (Addendum)
Escorted family to consult room and provided emotional and grief support.  Offered a word of prayer and assisted family in viewing body; gathered information for RN.   Note: granddaughter Chrissie Noa is ED RN for [facility] out of [place] [phone]  Next of kin:  Ludwig Lean (son) [address] [place], [place] Tel: [place], [place]   28-Apr-2017 0900  Clinical Encounter Type  Visited With Family  Visit Type Initial;Death  Referral From Nurse  Consult/Referral To Chaplain  Spiritual Encounters  Spiritual Needs Grief support  Stress Factors  Family Stress Factors Major life changes

## 2017-04-26 NOTE — ED Provider Notes (Signed)
[facility] EMERGENCY DEPARTMENT Provider Note   CSN: [id number] Arrival date & time: 2017-05-15  0630     History   Chief Complaint Chief Complaint  Patient presents with  . Chest Pain    HPI Patrick Armstrong is a 84 y.o. male.  Patient presents to the emergency department for evaluation of chest pain.  Patient reports that he woke up this morning, sat up on the edge of the bed and noticed that he was having sharp pains in the center of his chest.  He got up and went to the bathroom, noticed that the pain then started going into the middle of his back and into his abdomen.  He has no associated shortness of breath.  He developed nausea and vomiting, was given Zofran by brought to the ER.  EMS note that he was initially hypotensive, given a small fluid bolus with improvement of his blood pressure.      Past Medical History:  Diagnosis Date  . Arthritis   . Hypertension     Patient Active Problem List   Diagnosis Date Noted  . Chronic cough 10/21/2016  . HYPOKALEMIA 10/14/2009  . THROMBOCYTOPENIA 10/14/2009  . HYPERTENSION 10/14/2009  . PULMONARY FIBROSIS 10/14/2009  . ARTHRITIS, RHEUMATOID 10/14/2009        Home Medications    Prior to Admission medications   Medication Sig Start Date End Date Taking? Authorizing Provider  ALPRAZolam Duanne Moron) 1 MG tablet Take 1 mg by mouth.   Yes [provider]  aspirin EC 81 MG tablet Take by mouth.   Yes [provider]  atenolol (TENORMIN) 50 MG tablet Take 25 mg by mouth daily.    Yes [provider]  Capsicum, Cayenne, (CAYENNE FRUIT) 455 MG CAPS Take 1 tablet by mouth.   Yes [provider]  Cholecalciferol (D3 HIGH POTENCY) 2000 units CAPS Take 1 capsule by mouth.   Yes [provider]  hydrochlorothiazide (HYDRODIURIL) 25 MG tablet Take 25 mg by mouth daily.  02/21/12  Yes [provider]  leflunomide (ARAVA) 20 MG tablet Take 20 mg by mouth daily.   Yes [provider]  Omega-3 Fatty Acids (FISH OIL PO) Take 1 capsule by mouth daily.   Yes [provider]  metoprolol tartrate (LOPRESSOR) 25 MG tablet Take 25 mg by mouth 2 (two) times daily. 04/20/16   [provider]    Family History No family history on file.  Social History Social History   Tobacco Use  . Smoking status: Former Smoker    Packs/day: 1.00    Types: Cigarettes    Last attempt to quit: 06/25/1963    Years since quitting: 53.8  . Smokeless tobacco: Former Systems developer    Types: Chew    Quit date: 04/30/1985  Substance Use Topics  . Alcohol use: No    Frequency: Never  . Drug use: No     Allergies   Patient has no known allergies.   Review of Systems Review of Systems  Cardiovascular: Positive for chest pain.  Gastrointestinal: Positive for abdominal pain, nausea and vomiting.  Musculoskeletal: Positive for back pain.     Physical Exam Updated Vital Signs BP (!) 107/52   Pulse 81   Resp (!) 21   Ht 5\' 11"  (1.803 m)   Wt 76.7 kg (169 lb)   SpO2 100%   BMI 23.57 kg/m   Physical Exam  Constitutional: He is oriented to person, place, and time. He appears well-developed and  well-nourished. No distress.  HENT:  Head: Normocephalic and atraumatic.  Right Ear: Hearing normal.  Left Ear: Hearing normal.  Nose: Nose normal.  Mouth/Throat: Oropharynx is clear and moist and mucous membranes are normal.  Eyes: Conjunctivae and EOM are normal. Pupils are equal, round, and reactive to light.  Neck: Normal range of motion. Neck supple.  Cardiovascular: Regular rhythm, S1 normal and S2 normal. Exam reveals no gallop and no friction rub.  No murmur heard. Pulses:      Femoral pulses are 1+ on the right side, and 1+ on the left side.      Dorsalis pedis pulses are 1+ on the right side, and 1+ on the left side.  Pulmonary/Chest: Effort normal and breath sounds normal. No respiratory distress. He exhibits no tenderness.  Abdominal: Soft. Normal  appearance and bowel sounds are normal. There is no hepatosplenomegaly. There is no tenderness. There is no rebound, no guarding, no tenderness at McBurney's point and negative Murphy's sign. No hernia.  Musculoskeletal: Normal range of motion.  Neurological: He is alert and oriented to person, place, and time. He has normal strength. No cranial nerve deficit or sensory deficit. Coordination normal. GCS eye subscore is 4. GCS verbal subscore is 5. GCS motor subscore is 6.  Skin: Skin is warm, dry and intact. No rash noted. No cyanosis.  Psychiatric: He has a normal mood and affect. His speech is normal and behavior is normal. Thought content normal.  Nursing note and vitals reviewed.    ED Treatments / Results  Labs (all labs ordered are listed, but only abnormal results are displayed) Labs Reviewed  CBC WITH DIFFERENTIAL/PLATELET - Abnormal; Notable for the following components:      Result Value   WBC 18.3 (*)    RBC 4.11 (*)    Hemoglobin 12.6 (*)    HCT 38.2 (*)    Neutro Abs 13.8 (*)    Monocytes Absolute 1.6 (*)    Basophils Absolute 0.2 (*)    All other components within normal limits  COMPREHENSIVE METABOLIC PANEL - Abnormal; Notable for the following components:   Potassium 2.9 (*)    Glucose, Bld 212 (*)    BUN 22 (*)    Creatinine, Ser 1.35 (*)    Calcium 8.6 (*)    Total Protein 5.9 (*)    Albumin 3.4 (*)    GFR calc non Af Amer 47 (*)    GFR calc Af Amer 54 (*)    All other components within normal limits  LIPASE, BLOOD  URINALYSIS, ROUTINE W REFLEX MICROSCOPIC  I-STAT CG4 LACTIC ACID, ED  TYPE AND SCREEN  PREPARE RBC (CROSSMATCH)    EKG  EKG Interpretation  Date/Time:  04-22-17 06:35:08 EST Ventricular Rate:  63 PR Interval:    QRS Duration: 139 QT Interval:  494 QTC Calculation: 506 R Axis:   68 Text Interpretation:  Sinus rhythm Atrial premature complex Right bundle branch block No previous tracing Nonspecific ST abnormality Confirmed  by Orpah Greek ([id number]) on April 22, 2017 6:42:36 AM       Radiology Dg Chest Port 1 View  Result Date: 22-Apr-2017 CLINICAL DATA:  Aortic dissection. EXAM: PORTABLE CHEST 1 VIEW COMPARISON:  Preceding chest CT FINDINGS: Progressive white out of the left chest with mass effect on the mediastinum which is shifted towards the right. Endotracheal tube tip is in good position compared to the carina. Coarse interstitial opacities on the right, pulmonary fibrosis. Lucency over the right humeral shaft,  insignificant in this setting. Dr. Betsey Holiday is doing a central line on the patient, findings were immediately relayed through ER staff May 16, 2017 at 7:34 am. IMPRESSION: 1. Left hemothorax and mediastinal shift is progressive from preceding chest CT. 2. Endotracheal tube in good position. 3. Pulmonary fibrosis. Electronically Signed   By: Monte Fantasia M.D.   On: 16-May-2017 07:40   Dg Chest Port 1v Same Day  Result Date: 16-May-2017 CLINICAL DATA:  Confirm central line placement. EXAM: PORTABLE CHEST 1 VIEW COMPARISON:  Earlier today FINDINGS: Right IJ sheath with tip at the SVC level. No pneumothorax. Known aortic dissection with left hemothorax. There is better inflation than on prior. Mediastinal shift to the right, less prominent due to rotation. Pulmonary fibrosis. Endotracheal tube in good position. IMPRESSION: 1. New right IJ sheath in good position.  No pneumothorax. 2. Ruptured thoracic aortic aneurysm with left hemothorax and mediastinal shift. 3. Pulmonary fibrosis. Electronically Signed   By: Monte Fantasia M.D.   On: May 16, 2017 08:09   Ct Angio Chest/abd/pel For Dissection W &/or Wo Contrast  Result Date: 2017/05/16 CLINICAL DATA:  84 year old with acute onset of severe upper and lower back pain prior to emergency department arrival. Patient currently hemodynamically unstable according to the emergency department physician. EXAM: CT ANGIOGRAPHY CHEST, ABDOMEN AND PELVIS TECHNIQUE:  Multidetector CT imaging through the chest, abdomen and pelvis was performed using the standard protocol during bolus administration of intravenous contrast. Multiplanar reconstructed images and MIPs were obtained and reviewed to evaluate the vascular anatomy. CONTRAST:  174mL ISOVUE-370 IOPAMIDOL INJECTION 76% IV. COMPARISON:  CT chest 09/14/2012, 09/30/2009. No prior abdominopelvic CT. FINDINGS: CTA CHEST FINDINGS Cardiovascular: Unenhanced images demonstrate subintimal hemorrhage throughout the thoracic and upper abdominal aorta. Severe three-vessel coronary atherosclerosis and severe thoracic and upper abdominal aortic atherosclerosis is present. Enhanced images demonstrate a complex acute aortic dissection extending from the aortic root throughout the thoracic aorta with contrast in the true and false lumens. The dissection extends into the innominate artery. The left common carotid artery and the left subclavian artery arise from the true lumen and are not involved with the dissection. Heart size normal. Small amount of fluid or blood in the pericardium. Mediastinum/Nodes: Extensive mediastinal hemorrhage. No pathologic lymphadenopathy. Normal appearing esophagus. Small nodules involving the mildly enlarged thyroid gland, the largest approximately 10 mm. Lungs/Pleura: Large left hemoperitoneum, with evidence of active contrast extravasation into the left pleural space. No evidence of right hemoperitoneum or effusion. Emphysematous changes in both lungs with peripheral blebs. Peripheral interstitial pulmonary fibrosis, progressive since the 2014 examination. Mild passive atelectasis in the left upper lobe and left lower lobe related to the hemoperitoneum. No confluent airspace consolidation elsewhere. Central airways patent. Musculoskeletal: Degenerative disc disease and spondylosis throughout the thoracic spine and in the visualized lower cervical spine. No acute osseous abnormality. Review of the MIP images  confirms the above findings. CTA ABDOMEN AND PELVIS FINDINGS VASCULAR Aorta: Complex dissection involving the abdominal aorta to a level just above the aortic bifurcation, with marked narrowing of the true lumen in the infrarenal aorta. Celiac: Arises from the true lumen. Patent with high-grade stenosis at its origin due to calcified and noncalcified plaque. SMA: Arises from the true lumen. Widely patent despite atherosclerosis at its origin Renals: 2 right renal arteries, both arising from the true lumen, the superior artery widely patent at its origin and possible hemodynamically significant stenosis involving the inferior artery. Two left renal arteries, both arising from the true lumen, each with possible hemodynamically significant stenoses. IMA:  Arises from the true lumen.  Widely patent. Inflow: Severe iliofemoral atherosclerosis. Occlusion of the proximal left SFA. Veins: Not evaluated. Review of the MIP images confirms the above findings. NON-VASCULAR Hepatobiliary: Liver normal in size and appearance. Gallbladder normal in appearance without calcified gallstones. No biliary ductal dilation. Pancreas: Normal in appearance without evidence of mass, ductal dilation, or inflammation. Spleen: Normal in size and appearance. Adrenals/Urinary Tract: Normal appearing adrenal glands. Both kidneys enhance normally. Approximate 1.7 cm cyst arising from the mid right kidney. No significant abnormalities involving either kidney. No hydronephrosis. No urinary tract calculi. Distended urinary bladder with diverticula arising from the posterolateral bladder wall bilaterally, left larger than right. Stomach/Bowel: Stomach normal in appearance for the degree of distention. Normal-appearing small bowel. Extensive descending and sigmoid colon diverticulosis and scattered diverticula elsewhere in the colon without evidence of acute diverticulitis. Normal appendix in the right upper pelvis. Lymphatic: No pathologic  lymphadenopathy. Reproductive: Mild prostate gland enlargement. Prostatic calcifications. Normal seminal vesicles. Other: No evidence of hemoperitoneum. Musculoskeletal: Severe degenerative disc disease and spondylosis at L2-3 and L4-5. Facet degenerative changes diffusely throughout the lumbar spine. Mild compression fracture of the upper endplate of L1 which does not appear acute. Review of the MIP images confirms the above findings. IMPRESSION: 1. 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 2. The right innominate artery is involved with the dissection. The left common carotid artery and left subclavian artery arise from the true lumen. 3. The celiac, SMA, IMA and the 2 renal arteries bilaterally all arise from the true lumen. 4. Active bleeding into the left pleural space with a large left hemothorax. Extensive mediastinal hemorrhage. 5. Small pericardial effusion or hemopericardium. 6. No evidence of right hemothorax.  No evidence of hemoperitoneum. 7. Chronic interstitial pulmonary fibrosis which has progressed since 2014. Passive atelectasis involving the right upper lobe and right lower lobe related to the pneumoperitoneum. 8. Diffuse colonic diverticulosis, with extensive diverticulosis involving the descending and sigmoid colon, without evidence of acute diverticulitis. 9. Bladder diverticula arising from both the right and left posterolateral bladder wall. So severe three-vessel coronary atherosclerosis. I discussed these emergent findings by telephone on 05-17-17 at 7:24 a.m. with Dr. Joseph Berkshire, who verbally acknowledged these results. Electronically Signed   By: Evangeline Dakin M.D.   On: 05/17/2017 08:00    Procedures Procedure Name: Intubation Date/Time: 05/17/17 7:16 AM Performed by: Orpah Greek, MD Pre-anesthesia Checklist: Patient identified, Emergency Drugs  available, Suction available, Timeout performed and Patient being monitored Oxygen Delivery Method: Non-rebreather mask Preoxygenation: Pre-oxygenation with 100% oxygen Ventilation: Mask ventilation without difficulty Laryngoscope Size: Glidescope and 4 Grade View: Grade II Tube size: 7.5 mm Number of attempts: 1 Placement Confirmation: ETT inserted through vocal cords under direct vision,  CO2 detector and Breath sounds checked- equal and bilateral Secured at: 24 cm Tube secured with: ETT holder Dental Injury: Teeth and Oropharynx as per pre-operative assessment  Future Recommendations: Recommend- induction with short-acting agent, and alternative techniques readily available    .Central Line Date/Time: 17-May-2017 7:43 AM Performed by: Orpah Greek, MD Authorized by: Orpah Greek, MD   Consent:    Consent obtained:  Emergent situation Pre-procedure details:    Hand hygiene: Hand hygiene performed prior to insertion     Sterile barrier technique: All elements of maximal sterile technique followed     Skin preparation:  2% chlorhexidine   Skin preparation agent: Skin preparation  agent completely dried prior to procedure   Anesthesia (see MAR for exact dosages):    Anesthesia method:  None Procedure details:    Location:  R internal jugular   Patient position:  Flat   Procedural supplies:  Cordis   Catheter size:  8.5 Fr   Landmarks identified: yes     Ultrasound guidance: yes     Number of attempts:  2   Successful placement: yes   Post-procedure details:    Post-procedure:  Dressing applied and line sutured   Assessment:  Blood return through all ports, no pneumothorax on x-ray and placement verified by x-ray   Patient tolerance of procedure:  Tolerated well, no immediate complications   (including critical care time)  Medications Ordered in ED Medications  iopamidol (ISOVUE-370) 76 % injection 100 mL (100 mLs Intravenous Contrast Given 2017/05/12  0710)  norepinephrine (LEVOPHED) injection (37.54 mcg/mL Intravenous Given 05-12-17 0803)     Initial Impression / Assessment and Plan / ED Course  I have reviewed the triage vital signs and the nursing notes.  Pertinent labs & imaging results that were available during my care of the patient were reviewed by me and considered in my medical decision making (see chart for details).     Patient presents to the emergency department for evaluation of chest pain.  Patient reports sudden onset of chest pain around 3 AM.  Initially pain was in the center of his chest but then he started having pain in his back and into his abdomen.  He was transported to the ER by EMS who reported that he initially had low blood pressure.  His blood pressure improved with IV fluids.  At time of arrival he is still complaining of chest and abdominal pain.  Story was very suspicious for aortic dissection.  Patient had an i-STAT Chem-8 performed to confirm his creatinine and then was sent immediately for CT angiography of chest, abdomen, pelvis.  I did review this image myself and noted the presence of extensive dissection.  I did consult Dr. Roxan Hockey, cardiothoracic surgery at this point.  He did recommend transfer to the ER at [facility] for further evaluation, however reported that the patient survival was unlikely.  Had extensive and multiple conversations with the patient's son who is present here.  He was informed of the diagnosis and that his father was unlikely to survive this.  He does state that he still wants him transferred and to try and do everything, but expressed understanding that his father was likely to die.  While in radiology patient started to complain of increased pain and by the time he reached the ER room again, he was hypotensive.  He became unresponsive, was intubated.  Patient was persistently hypotensive despite IV fluids, was initiated on non-crossmatched blood.  Right IJ sheath introducer  was placed and patient was continued with fluid and blood resuscitation.  He was started on Levophed.  Blood pressure improved somewhat for transport.  Patient is stable enough for transport at this time, but it is understood that he might die during transport.  This was discussed with the son and he understands and did give his consent and expressed wishes for the transport.  CRITICAL CARE Performed by: Orpah Greek   Total critical care time: 80 minutes  Critical care time was exclusive of separately billable procedures and treating other patients.  Critical care was necessary to treat or prevent imminent or life-threatening deterioration.  Critical care was time spent personally  by me on the following activities: development of treatment plan with patient and/or surrogate as well as nursing, discussions with consultants, evaluation of patient's response to treatment, examination of patient, obtaining history from patient or surrogate, ordering and performing treatments and interventions, ordering and review of laboratory studies, ordering and review of radiographic studies, pulse oximetry and re-evaluation of patient's condition.    Final Clinical Impressions(s) / ED Diagnoses   Final diagnoses:  Dissection of thoracoabdominal aorta [facility])    ED Discharge Orders    None       Orpah Greek, MD 05/08/2017 [phone]

## 2017-04-26 NOTE — Consult Note (Signed)
Reason for Consult:Aortic dissection Referring Physician: Dr. Lindalou Hose [facility] ED  Patrick Armstrong is an 84 y.o. male.  HPI: 84yo man who developed sudden onset of severe chest pain that migrated into his abdomen and back. +N/V. EMS noted he was hypotensive and gave volume. Taken to ED at [facility]. CT chest showed a type I aortic dissection with rupture into the left chest. He became unresponsive and was intubated. Resuscitation with blood, fluids and norepinephrine. Transported to [facility].   On arrival to ED he was intubated, unresponsive, and in shock. Skin cool and gray, bradycardiac on monitor. Shortly after arrival to ED he became profoundly bradycardic and had no palpable pulse. Did not respond to epinephrine boluses. CPR initiated but stopped after discussing futility of care with his family members.  Past Medical History:  Diagnosis Date  . Arthritis   . Hypertension   . Pulmonary fibrosis ([place])     Past Surgical History:  Procedure Laterality Date  . TESTICLE SURGERY      No family history on file.  Social History:  reports that he quit smoking about 53 years ago. His smoking use included cigarettes. He smoked 1.00 pack per day. He quit smokeless tobacco use about 31 years ago. His smokeless tobacco use included chew. He reports that he does not drink alcohol or use drugs.  Allergies: No Known Allergies  Medications: Prior to Admission:  (Not in a hospital admission)  Results for orders placed or performed during the hospital encounter of 04/30/17 (from the past 48 hour(s))  CBC with Differential/Platelet     Status: Abnormal   Collection Time: 30-Apr-2017  6:40 AM  Result Value Ref Range   WBC 18.3 (H) 4.0 - 10.5 K/uL   RBC 4.11 (L) 4.22 - 5.81 MIL/uL   Hemoglobin 12.6 (L) 13.0 - 17.0 g/dL   HCT 38.2 (L) 39.0 - 52.0 %   MCV 92.9 78.0 - 100.0 fL   MCH 30.7 26.0 - 34.0 pg   MCHC 33.0 30.0 - 36.0 g/dL   RDW 12.9 11.5 - 15.5 %   Platelets 170 150 - 400 K/uL   Neutrophils Relative % 75 %   Lymphocytes Relative 12 %   Monocytes Relative 9 %   Eosinophils Relative 3 %   Basophils Relative 1 %   Neutro Abs 13.8 (H) 1.7 - 7.7 K/uL   Lymphs Abs 2.2 0.7 - 4.0 K/uL   Monocytes Absolute 1.6 (H) 0.1 - 1.0 K/uL   Eosinophils Absolute 0.5 0.0 - 0.7 K/uL   Basophils Absolute 0.2 (H) 0.0 - 0.1 K/uL   WBC Morphology FEW ATYP[ICAL LYMPHS   Comprehensive metabolic panel     Status: Abnormal   Collection Time: 2017/04/30  6:40 AM  Result Value Ref Range   Sodium 139 135 - 145 mmol/L   Potassium 2.9 (L) 3.5 - 5.1 mmol/L   Chloride 103 101 - 111 mmol/L   CO2 23 22 - 32 mmol/L   Glucose, Bld 212 (H) 65 - 99 mg/dL   BUN 22 (H) 6 - 20 mg/dL   Creatinine, Ser 1.35 (H) 0.61 - 1.24 mg/dL   Calcium 8.6 (L) 8.9 - 10.3 mg/dL   Total Protein 5.9 (L) 6.5 - 8.1 g/dL   Albumin 3.4 (L) 3.5 - 5.0 g/dL   AST 31 15 - 41 U/L   ALT 31 17 - 63 U/L   Alkaline Phosphatase 38 38 - 126 U/L   Total Bilirubin 0.7 0.3 - 1.2 mg/dL  GFR calc non Af Amer 47 (L) >60 mL/min   GFR calc Af Amer 54 (L) >60 mL/min    Comment: (NOTE) The eGFR has been calculated using the CKD EPI equation. This calculation has not been validated in all clinical situations. eGFR's persistently <60 mL/min signify possible Chronic Kidney Disease.    Anion gap 13 5 - 15  Lipase, blood     Status: None   Collection Time: 05/13/2017  6:40 AM  Result Value Ref Range   Lipase 42 11 - 51 U/L  Type and screen Ordered by PROVIDER DEFAULT     Status: None (Preliminary result)   Collection Time: 2017-05-13  6:45 AM  Result Value Ref Range   ABO/RH(D) PENDING    Antibody Screen PENDING    Sample Expiration 04/20/2017    Unit Number [id number]    Blood Component Type RBC LR PHER2    Unit division 00    Status of Unit ALLOCATED    Transfusion Status PENDING    Crossmatch Result PENDING   Prepare RBC (crossmatch)     Status: None (Preliminary result)   Collection Time: 05/13/2017  6:45 AM  Result Value Ref  Range   Order Confirmation ORDER PROCESSED BY BLOOD BANK   Type and screen Ordered by PROVIDER DEFAULT     Status: None (Preliminary result)   Collection Time: 05-13-2017  8:40 AM  Result Value Ref Range   ABO/RH(D) PENDING    Antibody Screen PENDING    Sample Expiration 04/20/2017    Unit Number [id number]    Blood Component Type RED CELLS,LR    Unit division 00    Status of Unit ISSUED    Unit tag comment VERBAL ORDERS PER DR MILLER    Transfusion Status OK TO TRANSFUSE    Crossmatch Result PENDING    Unit Number [id number]    Blood Component Type RBC LR PHER1    Unit division 00    Status of Unit ISSUED    Unit tag comment VERBAL ORDERS PER DR MILLER    Transfusion Status OK TO TRANSFUSE    Crossmatch Result PENDING    Unit Number [id number]    Blood Component Type RED CELLS,LR    Unit division 00    Status of Unit ISSUED    Unit tag comment VERBAL ORDERS PER DR MILLER    Transfusion Status OK TO TRANSFUSE    Crossmatch Result PENDING    Unit Number [id number]    Blood Component Type RBC LR PHER1    Unit division 00    Status of Unit ISSUED    Unit tag comment VERBAL ORDERS PER DR MILLER    Transfusion Status OK TO TRANSFUSE    Crossmatch Result PENDING   Prepare fresh frozen plasma     Status: None (Preliminary result)   Collection Time: 05-13-17  8:40 AM  Result Value Ref Range   Unit Number [id number]    Blood Component Type LIQ PLASMA    Unit division 00    Status of Unit ISSUED    Unit tag comment VERBAL ORDERS PER DR MILLER    Transfusion Status OK TO TRANSFUSE    Unit Number [id number]    Blood Component Type LIQ PLASMA    Unit division 00    Status of Unit ISSUED    Unit tag comment VERBAL ORDERS PER DR MILLER    Transfusion Status OK TO TRANSFUSE    Unit Number [id number]    Blood Component Type LIQ  PLASMA    Unit division 00    Status of Unit ISSUED    Unit tag comment VERBAL ORDERS PER DR MILLER    Transfusion Status OK TO  TRANSFUSE    Unit Number [id number]    Blood Component Type LIQ PLASMA    Unit division 00    Status of Unit ISSUED    Unit tag comment VERBAL ORDERS PER DR MILLER    Transfusion Status OK TO TRANSFUSE     Dg Chest Port 1 View  Result Date: 05/09/17 CLINICAL DATA:  Aortic dissection. EXAM: PORTABLE CHEST 1 VIEW COMPARISON:  Preceding chest CT FINDINGS: Progressive white out of the left chest with mass effect on the mediastinum which is shifted towards the right. Endotracheal tube tip is in good position compared to the carina. Coarse interstitial opacities on the right, pulmonary fibrosis. Lucency over the right humeral shaft, insignificant in this setting. Dr. Betsey Holiday is doing a central line on the patient, findings were immediately relayed through ER staff 05-09-2017 at 7:34 am. IMPRESSION: 1. Left hemothorax and mediastinal shift is progressive from preceding chest CT. 2. Endotracheal tube in good position. 3. Pulmonary fibrosis. Electronically Signed   By: Monte Fantasia M.D.   On: 05-09-2017 07:40   Dg Chest Port 1v Same Day  Result Date: 05-09-17 CLINICAL DATA:  Confirm central line placement. EXAM: PORTABLE CHEST 1 VIEW COMPARISON:  Earlier today FINDINGS: Right IJ sheath with tip at the SVC level. No pneumothorax. Known aortic dissection with left hemothorax. There is better inflation than on prior. Mediastinal shift to the right, less prominent due to rotation. Pulmonary fibrosis. Endotracheal tube in good position. IMPRESSION: 1. New right IJ sheath in good position.  No pneumothorax. 2. Ruptured thoracic aortic aneurysm with left hemothorax and mediastinal shift. 3. Pulmonary fibrosis. Electronically Signed   By: Monte Fantasia M.D.   On: 05-09-17 08:09   Ct Angio Chest/abd/pel For Dissection W &/or Wo Contrast  Result Date: 05-09-17 CLINICAL DATA:  84 year old with acute onset of severe upper and lower back pain prior to emergency department arrival. Patient currently  hemodynamically unstable according to the emergency department physician. EXAM: CT ANGIOGRAPHY CHEST, ABDOMEN AND PELVIS TECHNIQUE: Multidetector CT imaging through the chest, abdomen and pelvis was performed using the standard protocol during bolus administration of intravenous contrast. Multiplanar reconstructed images and MIPs were obtained and reviewed to evaluate the vascular anatomy. CONTRAST:  11m ISOVUE-370 IOPAMIDOL INJECTION 76% IV. COMPARISON:  CT chest 09/14/2012, 09/30/2009. No prior abdominopelvic CT. FINDINGS: CTA CHEST FINDINGS Cardiovascular: Unenhanced images demonstrate subintimal hemorrhage throughout the thoracic and upper abdominal aorta. Severe three-vessel coronary atherosclerosis and severe thoracic and upper abdominal aortic atherosclerosis is present. Enhanced images demonstrate a complex acute aortic dissection extending from the aortic root throughout the thoracic aorta with contrast in the true and false lumens. The dissection extends into the innominate artery. The left common carotid artery and the left subclavian artery arise from the true lumen and are not involved with the dissection. Heart size normal. Small amount of fluid or blood in the pericardium. Mediastinum/Nodes: Extensive mediastinal hemorrhage. No pathologic lymphadenopathy. Normal appearing esophagus. Small nodules involving the mildly enlarged thyroid gland, the largest approximately 10 mm. Lungs/Pleura: Large left hemoperitoneum, with evidence of active contrast extravasation into the left pleural space. No evidence of right hemoperitoneum or effusion. Emphysematous changes in both lungs with peripheral blebs. Peripheral interstitial pulmonary fibrosis, progressive since the 2014 examination. Mild passive atelectasis in the left upper lobe and left lower  lobe related to the hemoperitoneum. No confluent airspace consolidation elsewhere. Central airways patent. Musculoskeletal: Degenerative disc disease and  spondylosis throughout the thoracic spine and in the visualized lower cervical spine. No acute osseous abnormality. Review of the MIP images confirms the above findings. CTA ABDOMEN AND PELVIS FINDINGS VASCULAR Aorta: Complex dissection involving the abdominal aorta to a level just above the aortic bifurcation, with marked narrowing of the true lumen in the infrarenal aorta. Celiac: Arises from the true lumen. Patent with high-grade stenosis at its origin due to calcified and noncalcified plaque. SMA: Arises from the true lumen. Widely patent despite atherosclerosis at its origin Renals: 2 right renal arteries, both arising from the true lumen, the superior artery widely patent at its origin and possible hemodynamically significant stenosis involving the inferior artery. Two left renal arteries, both arising from the true lumen, each with possible hemodynamically significant stenoses. IMA: Arises from the true lumen.  Widely patent. Inflow: Severe iliofemoral atherosclerosis. Occlusion of the proximal left SFA. Veins: Not evaluated. Review of the MIP images confirms the above findings. NON-VASCULAR Hepatobiliary: Liver normal in size and appearance. Gallbladder normal in appearance without calcified gallstones. No biliary ductal dilation. Pancreas: Normal in appearance without evidence of mass, ductal dilation, or inflammation. Spleen: Normal in size and appearance. Adrenals/Urinary Tract: Normal appearing adrenal glands. Both kidneys enhance normally. Approximate 1.7 cm cyst arising from the mid right kidney. No significant abnormalities involving either kidney. No hydronephrosis. No urinary tract calculi. Distended urinary bladder with diverticula arising from the posterolateral bladder wall bilaterally, left larger than right. Stomach/Bowel: Stomach normal in appearance for the degree of distention. Normal-appearing small bowel. Extensive descending and sigmoid colon diverticulosis and scattered diverticula  elsewhere in the colon without evidence of acute diverticulitis. Normal appendix in the right upper pelvis. Lymphatic: No pathologic lymphadenopathy. Reproductive: Mild prostate gland enlargement. Prostatic calcifications. Normal seminal vesicles. Other: No evidence of hemoperitoneum. Musculoskeletal: Severe degenerative disc disease and spondylosis at L2-3 and L4-5. Facet degenerative changes diffusely throughout the lumbar spine. Mild compression fracture of the upper endplate of L1 which does not appear acute. Review of the MIP images confirms the above findings. IMPRESSION: 1. 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 2. The right innominate artery is involved with the dissection. The left common carotid artery and left subclavian artery arise from the true lumen. 3. The celiac, SMA, IMA and the 2 renal arteries bilaterally all arise from the true lumen. 4. Active bleeding into the left pleural space with a large left hemothorax. Extensive mediastinal hemorrhage. 5. Small pericardial effusion or hemopericardium. 6. No evidence of right hemothorax.  No evidence of hemoperitoneum. 7. Chronic interstitial pulmonary fibrosis which has progressed since 2014. Passive atelectasis involving the right upper lobe and right lower lobe related to the pneumoperitoneum. 8. Diffuse colonic diverticulosis, with extensive diverticulosis involving the descending and sigmoid colon, without evidence of acute diverticulitis. 9. Bladder diverticula arising from both the right and left posterolateral bladder wall. So severe three-vessel coronary atherosclerosis. I discussed these emergent findings by telephone on May 08, 2017 at 7:24 a.m. with Dr. Joseph Berkshire, who verbally acknowledged these results. Electronically Signed   By: Evangeline Dakin M.D.   On: May 08, 2017 08:00  I personally reviewed the CT and CXR and concur  with the findings noted above  Review of Systems  Unable to perform ROS: Intubated   Blood pressure 95/82, pulse (!) 48, resp. rate (!) 21,  height 5' 11"  (1.803 m), weight 169 lb (76.7 kg), SpO2 100 %. Physical Exam  Vitals reviewed. Constitutional:  Elderly man intubated  Cardiovascular:  Bradycardic, difficult to auscultate HS  Respiratory:  Clear anteriorly on right  Skin:  Cool and mottled    Assessment/Plan: 84 yo man who presented with severe chest, back and abdominal pain and had a type I aortic dissection with rupture into the left pleural space. He arrested on arrival to ED. Did not respond to initial CPR + epi. This was not a salvageable situation and resuscitation attempts were stopped. Melrose Nakayama Apr 18, 2017, 9:16 AM

## 2017-04-26 NOTE — Progress Notes (Signed)
RT Care Note Late entry: Arrived to the patient's room in the ED, the MD with another MD was at the head of bed. The patient was intubated prior to me entering the room. Per RN the patient was intubated at 0716. I secured the 7.5 tube with tube holder at 24 cm at the lip. MD bagged while I set up the vent to place patient on the vent.

## 2017-04-26 NOTE — ED Notes (Signed)
Critical result. Lactic Acid. 5.08 Dr. Betsey Holiday notified.

## 2017-04-26 NOTE — ED Notes (Signed)
PT went unresponsive at this time. PT moved to room 2 for intubation. Son at bedside.

## 2017-04-26 NOTE — ED Notes (Addendum)
PT intubated with 7.5 tube and secured 24 at the lip. Xray at bedside.

## 2017-04-26 NOTE — ED Notes (Signed)
Updated report given to Penelope Coop, [place]

## 2017-04-26 NOTE — ED Notes (Signed)
PT left with [facility] at this time.

## 2017-04-26 NOTE — ED Notes (Signed)
Chaplain at bedside

## 2017-04-26 NOTE — ED Provider Notes (Signed)
The patient arrives by critical care transport with a weak pulse, requiring Levophed support and within a short time the patient became bradycardic and then asystolic.  CPR was initiated, I personally directed CPR, the central line was used for infusion of levo fed, epinephrine however no return of spontaneous circulation was obtained, cardiothoracic surgeon, Dr. Roxan Hockey arrived and thankfully with his expertise it was realized that this patient was not salvageable and unfortunately expired at the time of 9:10 AM secondary to a severe aortic dissection with intrathoracic hemorrhage.  The family was updated by myself in the presence of a chaplain.  This is a medical cause to the patient's death and no autopsy is needed.  The patient will be released to the morgue  Cardiopulmonary Resuscitation (CPR) Procedure Note Directed/Performed by: Johnna Acosta I personally directed ancillary staff and/or performed CPR in an effort to regain return of spontaneous circulation and to maintain cardiac, neuro and systemic perfusion.     Patrick Chapel, MD May 06, 2017 [phone]

## 2017-04-26 NOTE — ED Notes (Signed)
Margo in bed control made aware pt is being transported to morgue.

## 2017-04-26 NOTE — ED Notes (Signed)
Family at bedside stated they were okay with patient being transported to morgue at this time

## 2017-04-26 NOTE — ED Notes (Signed)
Patient's son Coralyn Pear (Next of kin) and granddaughter at bedside

## 2017-04-26 DEATH — deceased

## 2017-04-27 MED FILL — Medication: Qty: 1 | Status: AC

## 2018-12-27 IMAGING — CR DG CHEST 1V PORT SAME DAY
1 series · 2 of 2 positions shown · non-contrast
Comparison: Earlier today

CLINICAL DATA: Confirm central line placement.

EXAM:
PORTABLE CHEST 1 VIEW

[Series 1: ap portable · 0.17mm/px · 2 of 2 slices shown]
[im 1/2]
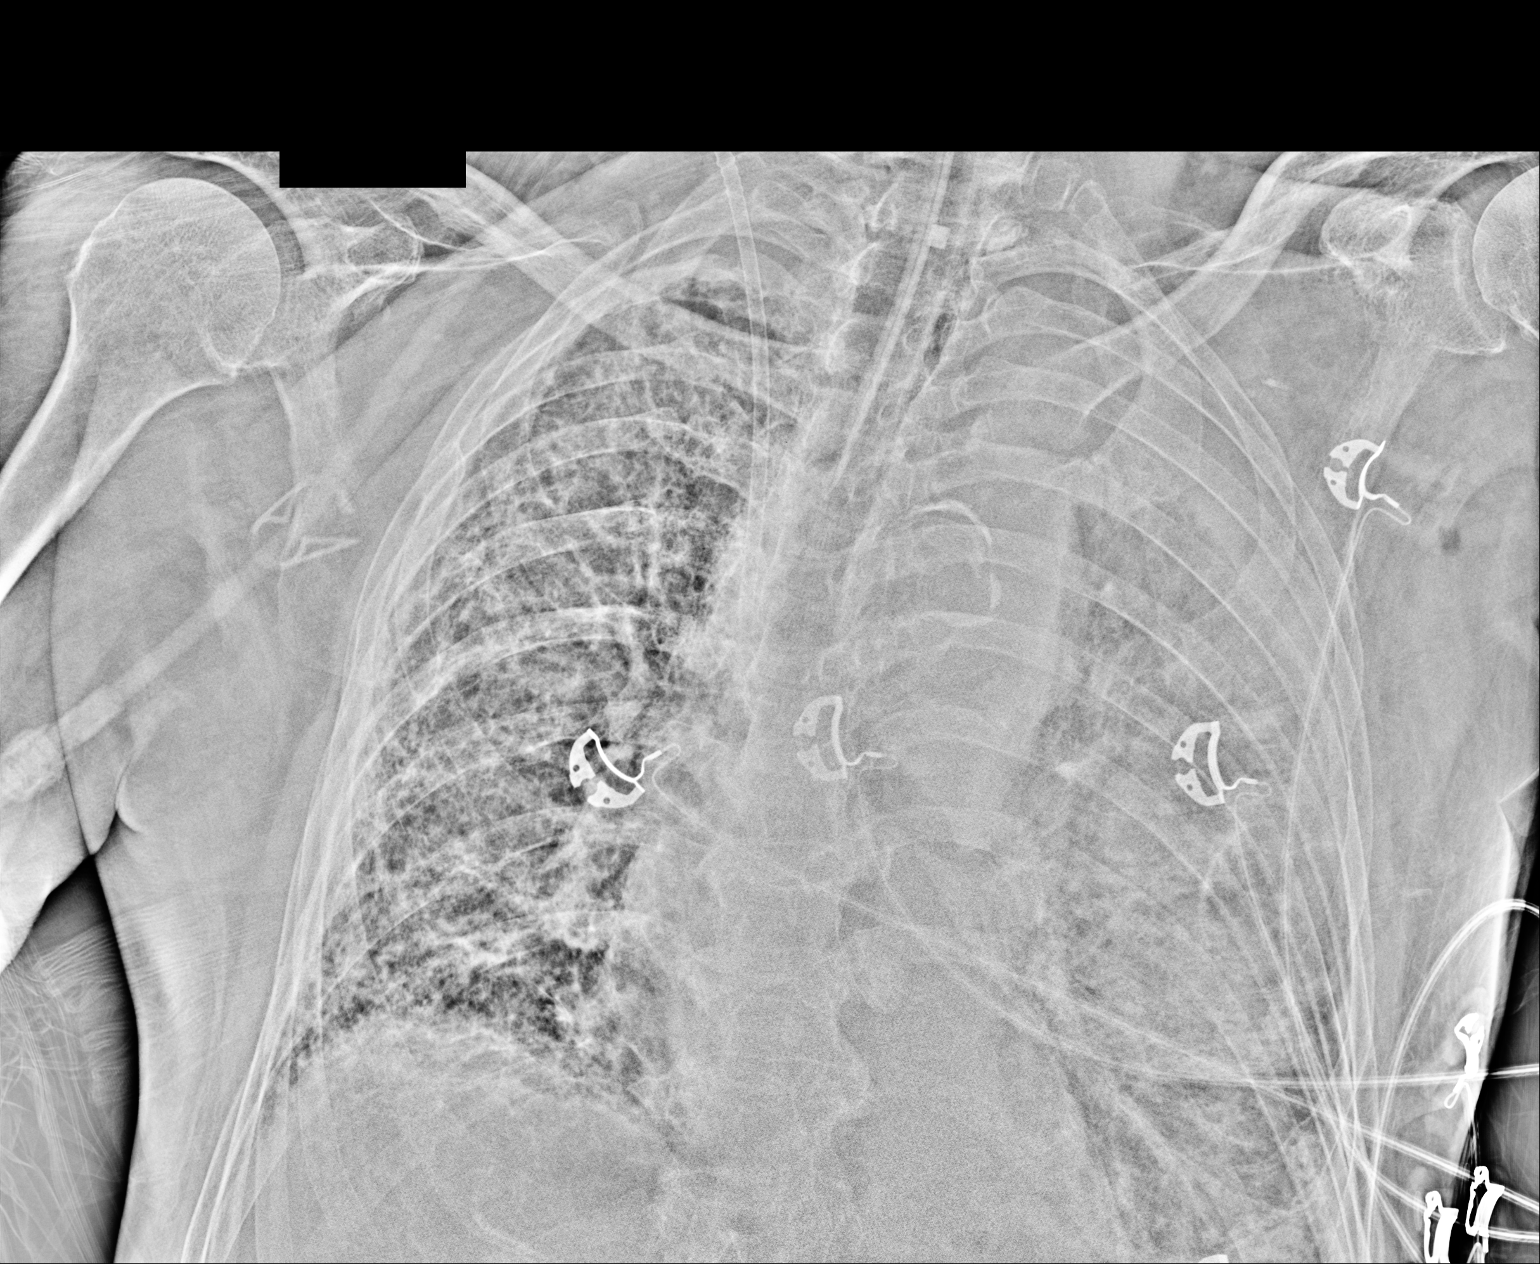
[im 2/2]
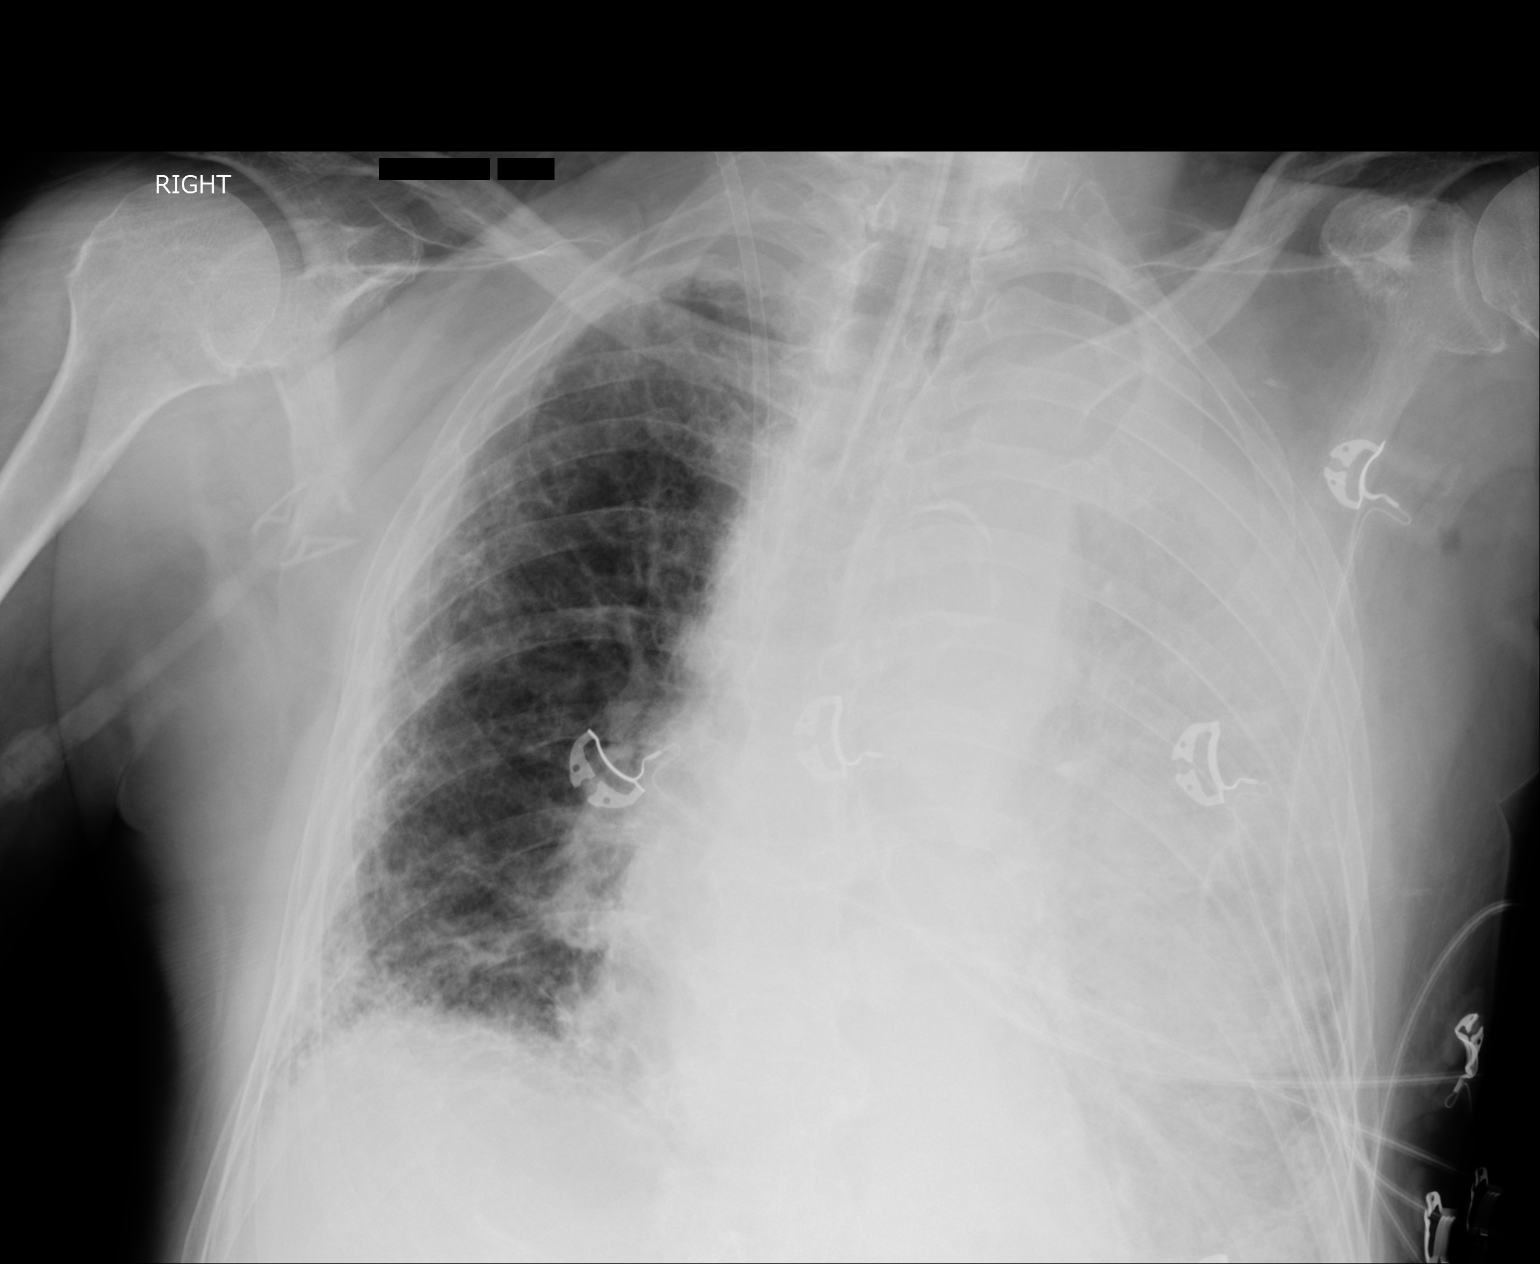

[2 of 2 positions shown; findings below may reference images not displayed]

FINDINGS: Right IJ sheath with tip at the SVC level. No pneumothorax. Known
aortic dissection with left hemothorax. There is better inflation
than on prior. Mediastinal shift to the right, less prominent due to
rotation. Pulmonary fibrosis. Endotracheal tube in good position.
IMPRESSION: 1. New right IJ sheath in good position.  No pneumothorax.
2. Ruptured thoracic aortic aneurysm with left hemothorax and
mediastinal shift.
3. Pulmonary fibrosis.

## 2018-12-27 IMAGING — CR DG CHEST 1V PORT
1 series · 2 of 2 positions shown · non-contrast
Comparison: Preceding chest CT

CLINICAL DATA: Aortic dissection.

EXAM:
PORTABLE CHEST 1 VIEW

[Series 1: portable · 0.17mm/px · 2 of 2 slices shown]
[im 1/2]
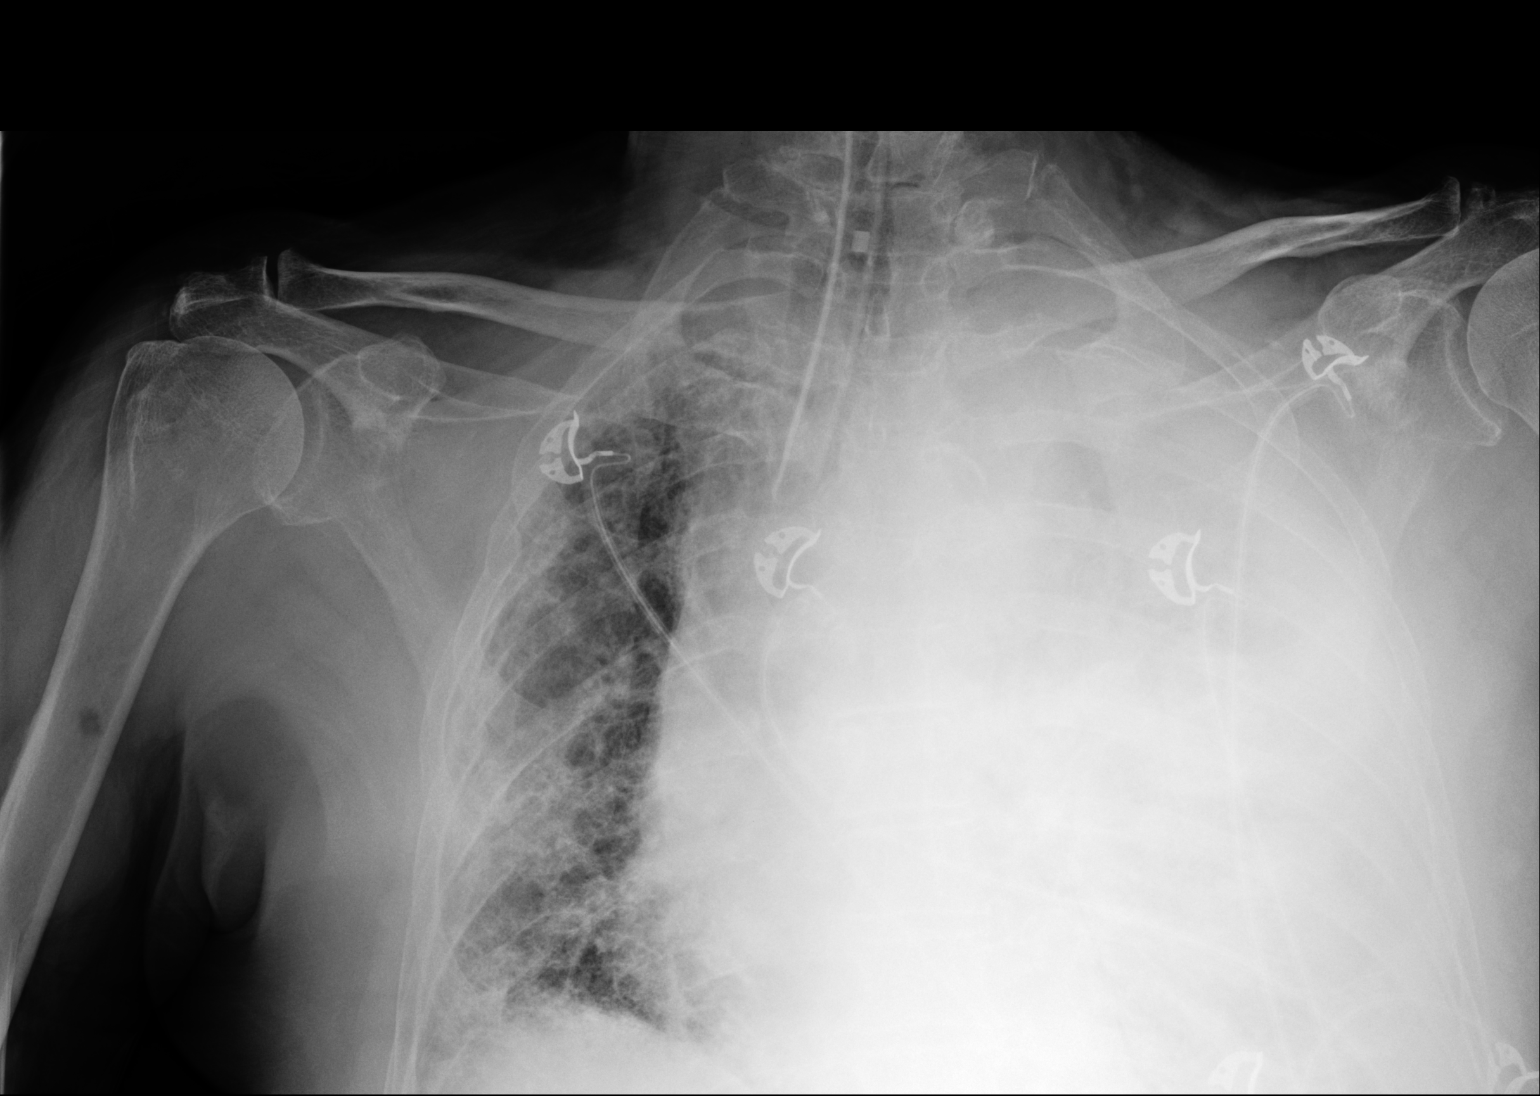
[im 2/2]
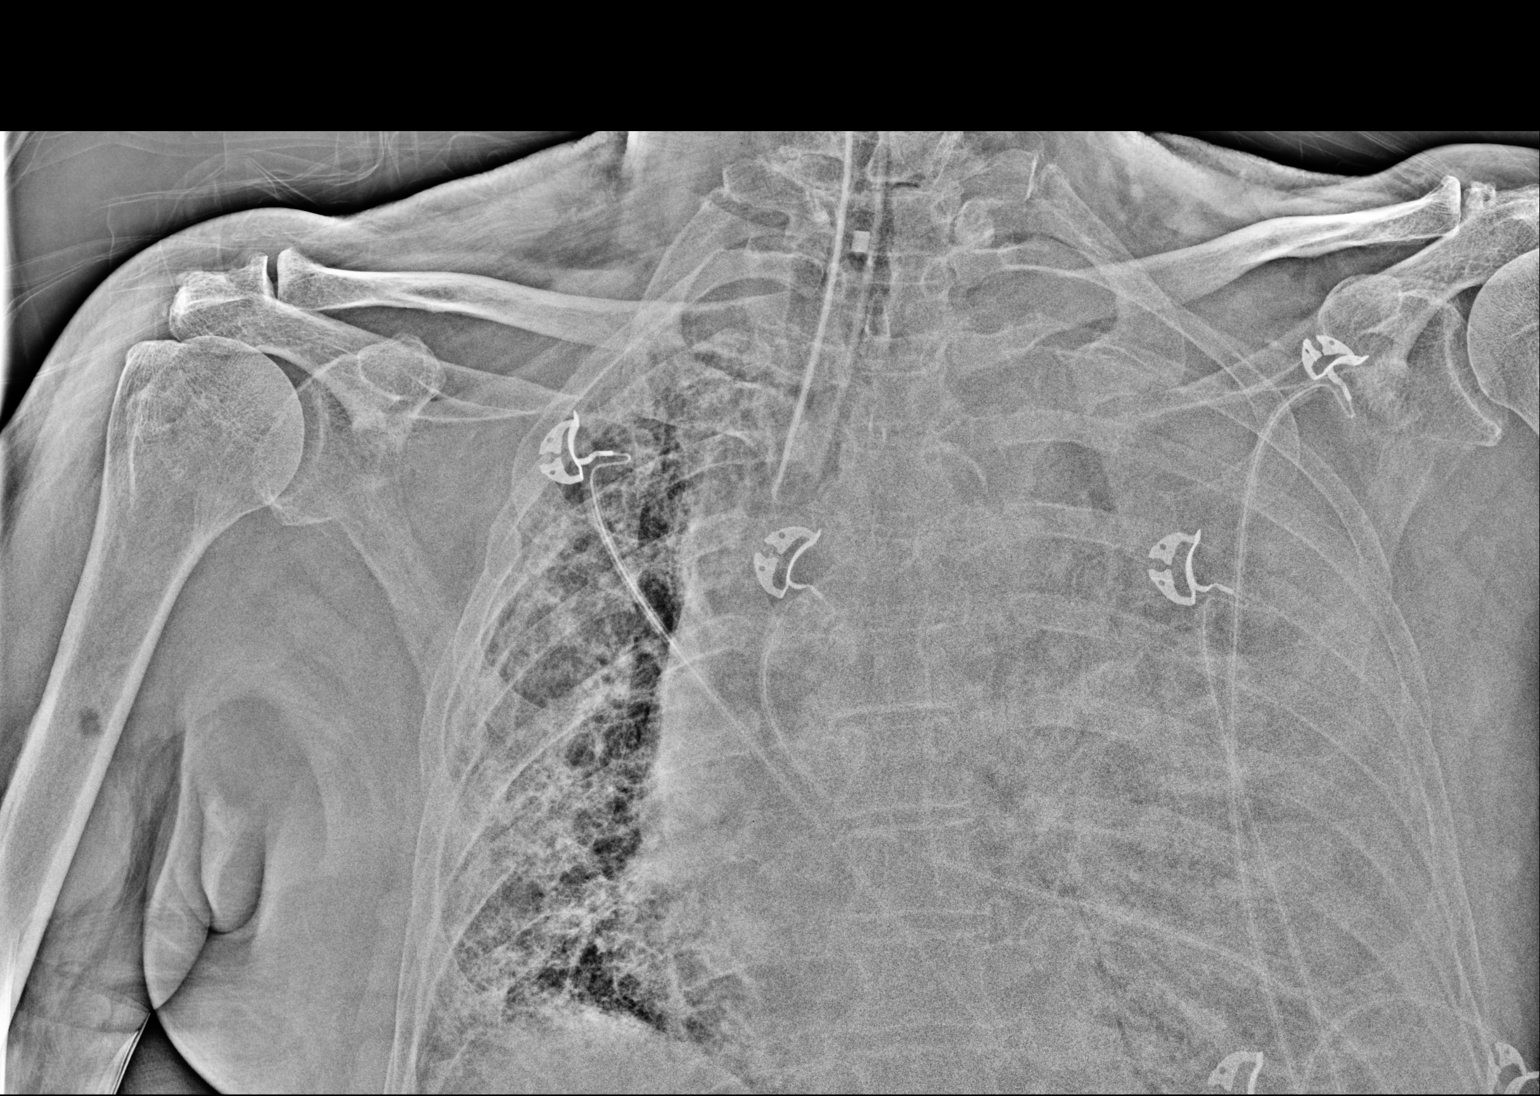

[2 of 2 positions shown; findings below may reference images not displayed]

FINDINGS: Progressive white out of the left chest with mass effect on the
mediastinum which is shifted towards the right. Endotracheal tube
tip is in good position compared to the carina. Coarse interstitial
opacities on the right, pulmonary fibrosis. Lucency over the right
humeral shaft, insignificant in this setting.

Dr. Jered is doing a central line on the patient, findings were
immediately relayed through ER staff 04/17/2017 at [DATE].
IMPRESSION: 1. Left hemothorax and mediastinal shift is progressive from
preceding chest CT.
2. Endotracheal tube in good position.
3. Pulmonary fibrosis.

## 2018-12-27 IMAGING — CT CT ANGIO CHEST-ABD-PELV FOR DISSECTION W/ AND WO/W CM
2 of 7 series · 11 of 46 positions shown, 12 images · IV contrast (Isovue)
Comparison: CT chest 09/14/2012, 09/30/2009. No prior
abdominopelvic CT.

CLINICAL DATA: 85-year-old with acute onset of severe upper and
currently hemodynamically unstable according to the emergency
department physician.

EXAM:
CT ANGIOGRAPHY CHEST, ABDOMEN AND PELVIS
TECHNIQUE: Multidetector CT imaging through the chest, abdomen and pelvis was
performed using the standard protocol during bolus administration of
intravenous contrast. Multiplanar reconstructed images and MIPs were
obtained and reviewed to evaluate the vascular anatomy.
CONTRAST:  100mL [id number] IOPAMIDOL INJECTION 76% IV.

[Series 6: dissection axial arterial · axial · arterial · 0.76mm/px · z∈[+824,+1382]mm · 8 of 218 slices shown, 9 images]
[im 16/218  soft-tissue]
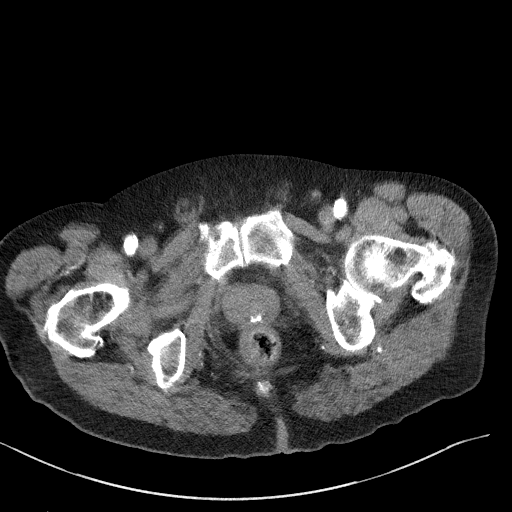
[im 16/218  bone]
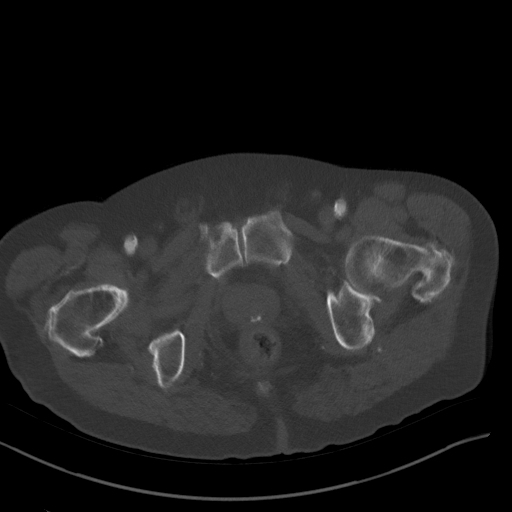
[im 47/218  soft-tissue]
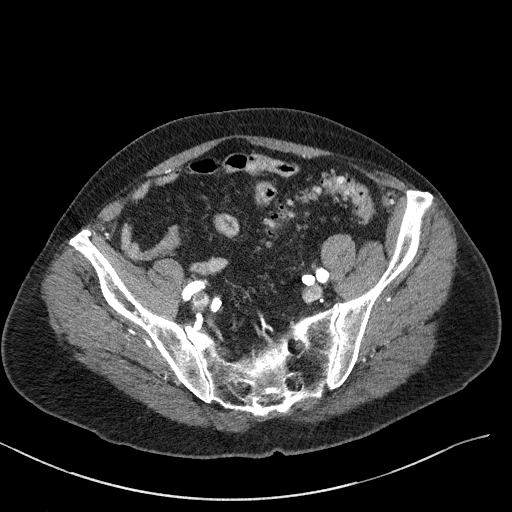
[im 78/218  soft-tissue]
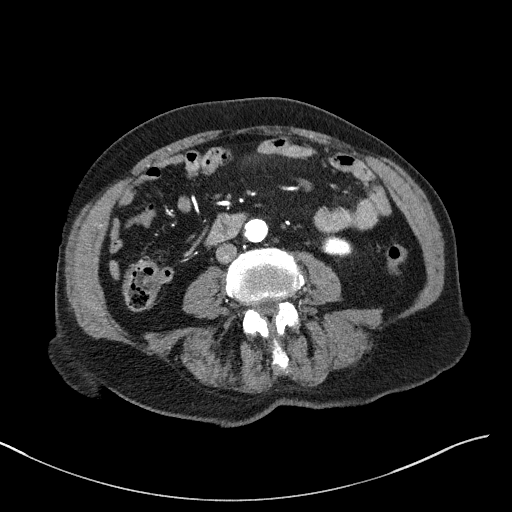
[im 94/218  soft-tissue]
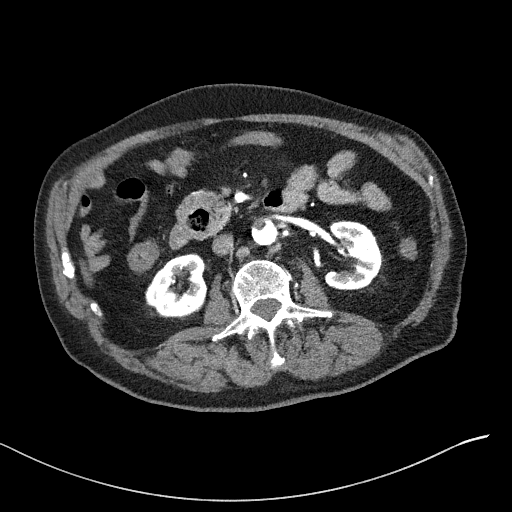
[im 125/218  soft-tissue]
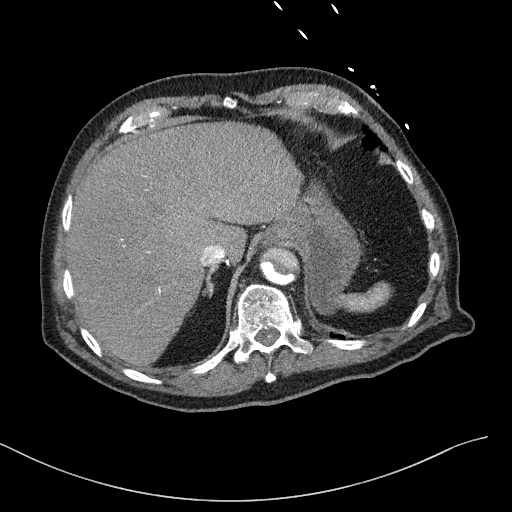
[im 140/218  soft-tissue]
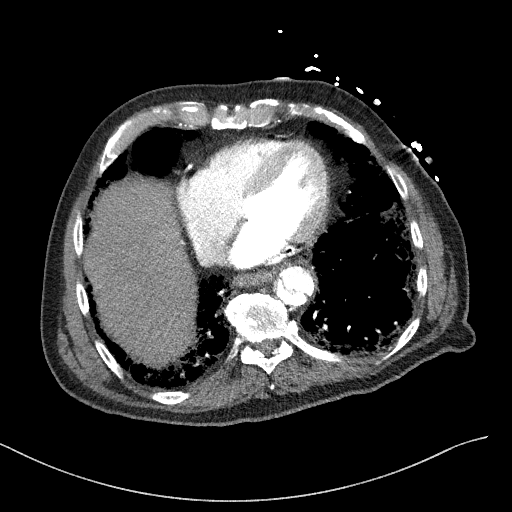
[im 171/218  soft-tissue]
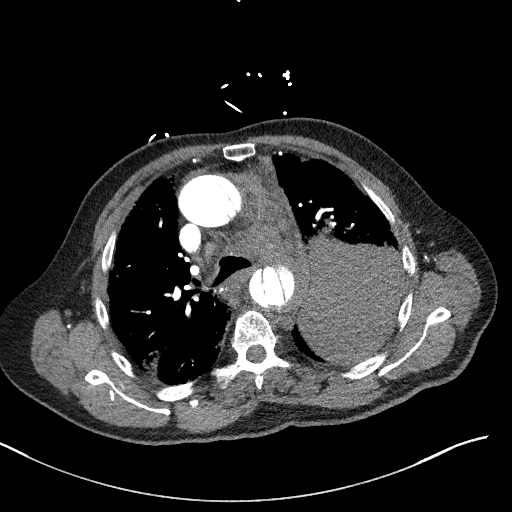
[im 202/218  soft-tissue]
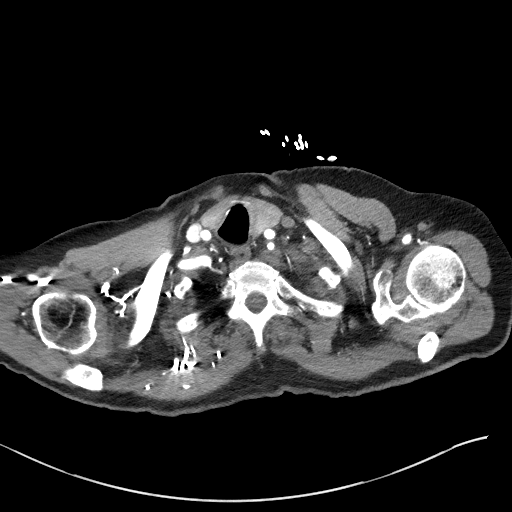

[Series 10: coronals · coronal · 0.77mm/px · 3 of 169 slices shown]
[im 43/169  soft-tissue]
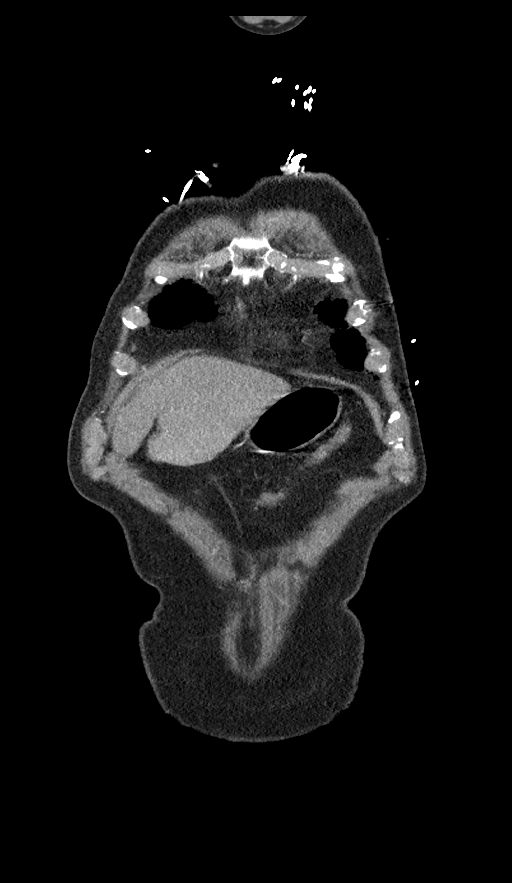
[im 85/169  soft-tissue]
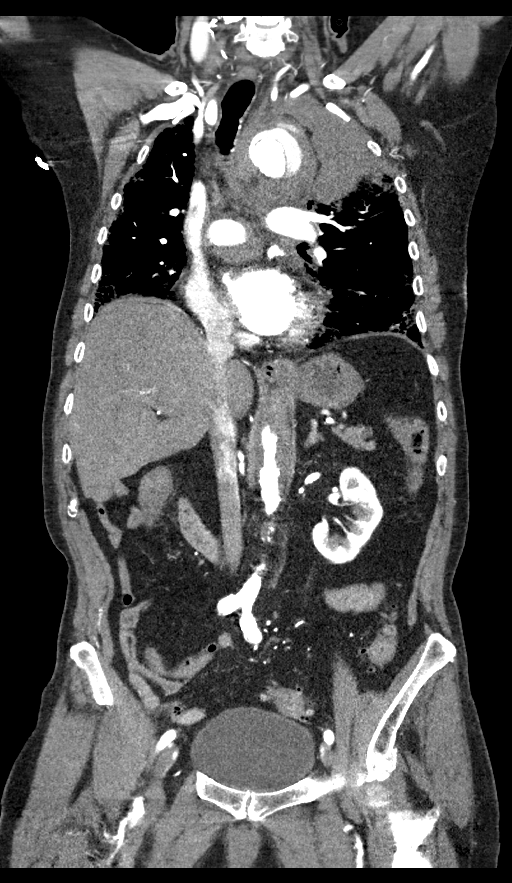
[im 127/169  soft-tissue]
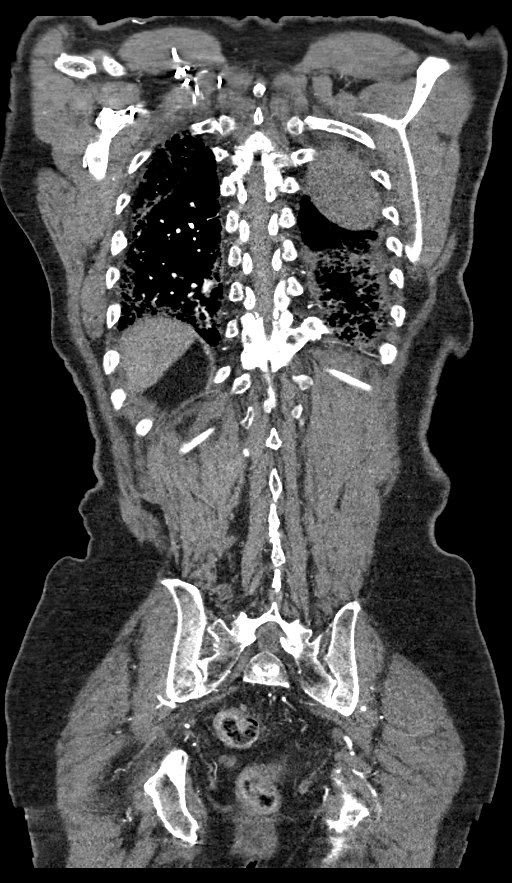

[11 of 46 positions shown; findings below may reference images not displayed]

FINDINGS: CTA CHEST FINDINGS

Cardiovascular: Unenhanced images demonstrate subintimal hemorrhage
throughout the thoracic and upper abdominal aorta. Severe
three-vessel coronary atherosclerosis and severe thoracic and upper
abdominal aortic atherosclerosis is present.

Enhanced images demonstrate a complex acute aortic dissection
extending from the aortic root throughout the thoracic aorta with
contrast in the true and false lumens. The dissection extends into
the innominate artery. The left common carotid artery and the left
subclavian artery arise from the true lumen and are not involved
with the dissection.

Heart size normal. Small amount of fluid or blood in the
pericardium.

Mediastinum/Nodes: Extensive mediastinal hemorrhage. No pathologic
lymphadenopathy. Normal appearing esophagus. Small nodules involving
the mildly enlarged thyroid gland, the largest approximately 10 mm.

Lungs/Pleura: Large left hemoperitoneum, with evidence of active
contrast extravasation into the left pleural space. No evidence of
right hemoperitoneum or effusion. Emphysematous changes in both
lungs with peripheral blebs. Peripheral interstitial pulmonary
fibrosis, progressive since the 5638 examination. Mild passive
atelectasis in the left upper lobe and left lower lobe related to
the hemoperitoneum. No confluent airspace consolidation elsewhere.
Central airways patent.

Musculoskeletal: Degenerative disc disease and spondylosis
throughout the thoracic spine and in the visualized lower cervical
spine. No acute osseous abnormality.

Review of the MIP images confirms the above findings.

CTA ABDOMEN AND PELVIS FINDINGS

VASCULAR

Aorta: Complex dissection involving the abdominal aorta to a level
just above the aortic bifurcation, with marked narrowing of the true
lumen in the infrarenal aorta.

Celiac: Arises from the true lumen. Patent with high-grade stenosis
at its origin due to calcified and noncalcified plaque.

SMA: Arises from the true lumen. Widely patent despite
atherosclerosis at its origin

Renals: 2 right renal arteries, both arising from the true lumen,
the superior artery widely patent at its origin and possible
hemodynamically significant stenosis involving the inferior artery.
Two left renal arteries, both arising from the true lumen, each with
possible hemodynamically significant stenoses.

IMA: Arises from the true lumen.  Widely patent.

Inflow: Severe iliofemoral atherosclerosis. Occlusion of the
proximal left SFA.

Veins: Not evaluated.

Review of the MIP images confirms the above findings.

NON-VASCULAR

Hepatobiliary: Liver normal in size and appearance. Gallbladder
normal in appearance without calcified gallstones. No biliary ductal
dilation.

Pancreas: Normal in appearance without evidence of mass, ductal
dilation, or inflammation.

Spleen: Normal in size and appearance.

Adrenals/Urinary Tract: Normal appearing adrenal glands. Both
kidneys enhance normally. Approximate 1.7 cm cyst arising from the
mid right kidney. No significant abnormalities involving either
kidney. No hydronephrosis. No urinary tract calculi. Distended
urinary bladder with diverticula arising from the posterolateral
bladder wall bilaterally, left larger than right.

Stomach/Bowel: Stomach normal in appearance for the degree of
distention. Normal-appearing small bowel. Extensive descending and
sigmoid colon diverticulosis and scattered diverticula elsewhere in
the colon without evidence of acute diverticulitis. Normal appendix
in the right upper pelvis.

Lymphatic: No pathologic lymphadenopathy.

Reproductive: Mild prostate gland enlargement. Prostatic
calcifications. Normal seminal vesicles.

Other: No evidence of hemoperitoneum.

Musculoskeletal: Severe degenerative disc disease and spondylosis at
L2-3 and L4-5. Facet degenerative changes diffusely throughout the
lumbar spine. Mild compression fracture of the upper endplate of L1
which does not appear acute.

Review of the MIP images confirms the above findings.
IMPRESSION: 1. Acute [HOSPITAL] type A aortic dissection extending from the aortic
root through the distal abdominal aorta, ending just above the
aortic bifurcation.
2. The right innominate artery is involved with the dissection. The
left common carotid artery and left subclavian artery arise from the
true lumen.
3. The celiac, SMA, IMA and the 2 renal arteries bilaterally all
arise from the true lumen.
4. Active bleeding into the left pleural space with a large left
hemothorax. Extensive mediastinal hemorrhage.
5. Small pericardial effusion or hemopericardium.
6. No evidence of right hemothorax.  No evidence of hemoperitoneum.
7. Chronic interstitial pulmonary fibrosis which has progressed
since 5638. Passive atelectasis involving the right upper lobe and
right lower lobe related to the pneumoperitoneum.
8. Diffuse colonic diverticulosis, with extensive diverticulosis
involving the descending and sigmoid colon, without evidence of
acute diverticulitis.
9. Bladder diverticula arising from both the right and left
posterolateral bladder wall. So severe three-vessel coronary
atherosclerosis.
I discussed these emergent findings by telephone on 04/17/2017 at
[DATE] a.m. with Dr. IN KEU LEO DA, who verbally acknowledged
these results.
